# Patient Record
Sex: Female | Born: 1950 | Race: White | Hispanic: No | Marital: Married | Smoking: Former smoker
Health system: Southern US, Community
[De-identification: ages and names within clinical notes are randomized; demographics above are authoritative.]

## PROBLEM LIST (undated history)

## (undated) DIAGNOSIS — K439 Ventral hernia without obstruction or gangrene: Secondary | ICD-10-CM

## (undated) DIAGNOSIS — I2510 Atherosclerotic heart disease of native coronary artery without angina pectoris: Secondary | ICD-10-CM

## (undated) DIAGNOSIS — I70203 Unspecified atherosclerosis of native arteries of extremities, bilateral legs: Secondary | ICD-10-CM

## (undated) DIAGNOSIS — I10 Essential (primary) hypertension: Secondary | ICD-10-CM

## (undated) DIAGNOSIS — I219 Acute myocardial infarction, unspecified: Secondary | ICD-10-CM

## (undated) DIAGNOSIS — K219 Gastro-esophageal reflux disease without esophagitis: Secondary | ICD-10-CM

## (undated) DIAGNOSIS — H548 Legal blindness, as defined in USA: Secondary | ICD-10-CM

## (undated) DIAGNOSIS — K922 Gastrointestinal hemorrhage, unspecified: Secondary | ICD-10-CM

## (undated) DIAGNOSIS — R109 Unspecified abdominal pain: Secondary | ICD-10-CM

## (undated) DIAGNOSIS — F419 Anxiety disorder, unspecified: Secondary | ICD-10-CM

## (undated) DIAGNOSIS — F329 Major depressive disorder, single episode, unspecified: Secondary | ICD-10-CM

## (undated) DIAGNOSIS — E860 Dehydration: Secondary | ICD-10-CM

## (undated) DIAGNOSIS — I959 Hypotension, unspecified: Secondary | ICD-10-CM

## (undated) DIAGNOSIS — C801 Malignant (primary) neoplasm, unspecified: Secondary | ICD-10-CM

## (undated) DIAGNOSIS — E119 Type 2 diabetes mellitus without complications: Secondary | ICD-10-CM

## (undated) DIAGNOSIS — M479 Spondylosis, unspecified: Secondary | ICD-10-CM

## (undated) DIAGNOSIS — I6523 Occlusion and stenosis of bilateral carotid arteries: Secondary | ICD-10-CM

## (undated) DIAGNOSIS — R000 Tachycardia, unspecified: Secondary | ICD-10-CM

## (undated) DIAGNOSIS — L0232 Furuncle of buttock: Secondary | ICD-10-CM

## (undated) DIAGNOSIS — N390 Urinary tract infection, site not specified: Secondary | ICD-10-CM

## (undated) DIAGNOSIS — K430 Incisional hernia with obstruction, without gangrene: Secondary | ICD-10-CM

## (undated) DIAGNOSIS — M1990 Unspecified osteoarthritis, unspecified site: Secondary | ICD-10-CM

## (undated) DIAGNOSIS — E7800 Pure hypercholesterolemia, unspecified: Secondary | ICD-10-CM

## (undated) DIAGNOSIS — D509 Iron deficiency anemia, unspecified: Secondary | ICD-10-CM

## (undated) DIAGNOSIS — K529 Noninfective gastroenteritis and colitis, unspecified: Secondary | ICD-10-CM

## (undated) DIAGNOSIS — I447 Left bundle-branch block, unspecified: Secondary | ICD-10-CM

## (undated) DIAGNOSIS — I739 Peripheral vascular disease, unspecified: Secondary | ICD-10-CM

## (undated) DIAGNOSIS — K635 Polyp of colon: Secondary | ICD-10-CM

## (undated) HISTORY — DX: Hypotension, unspecified: I95.9

## (undated) HISTORY — PX: HAND SURGERY: SHX662

## (undated) HISTORY — PX: ABDOMINAL HERNIA REPAIR: SHX539

## (undated) HISTORY — PX: ABDOMINAL HYSTERECTOMY: SHX81

## (undated) HISTORY — PX: ABDOMINAL AORTIC ANEURYSM REPAIR: SUR1152

## (undated) HISTORY — PX: CHOLECYSTECTOMY: SHX55

## (undated) HISTORY — DX: Unspecified abdominal pain: R10.9

## (undated) HISTORY — DX: Peripheral vascular disease, unspecified: I73.9

## (undated) HISTORY — DX: Gastrointestinal hemorrhage, unspecified: K92.2

## (undated) HISTORY — DX: Major depressive disorder, single episode, unspecified: F32.9

## (undated) HISTORY — DX: Left bundle-branch block, unspecified: I44.7

## (undated) HISTORY — DX: Type 2 diabetes mellitus without complications: E11.9

## (undated) HISTORY — DX: Pure hypercholesterolemia, unspecified: E78.00

## (undated) HISTORY — DX: Dehydration: E86.0

## (undated) HISTORY — DX: Ventral hernia without obstruction or gangrene: K43.9

## (undated) HISTORY — DX: Tachycardia, unspecified: R00.0

## (undated) HISTORY — DX: Polyp of colon: K63.5

## (undated) HISTORY — DX: Acute myocardial infarction, unspecified: I21.9

## (undated) HISTORY — PX: BACK SURGERY: SHX140

## (undated) HISTORY — DX: Atherosclerotic heart disease of native coronary artery without angina pectoris: I25.10

## (undated) HISTORY — DX: Malignant (primary) neoplasm, unspecified: C80.1

## (undated) HISTORY — DX: Noninfective gastroenteritis and colitis, unspecified: K52.9

## (undated) HISTORY — DX: Incisional hernia with obstruction, without gangrene: K43.0

## (undated) HISTORY — DX: Urinary tract infection, site not specified: N39.0

## (undated) HISTORY — DX: Unspecified atherosclerosis of native arteries of extremities, bilateral legs: I70.203

## (undated) HISTORY — DX: Essential (primary) hypertension: I10

## (undated) HISTORY — DX: Spondylosis, unspecified: M47.9

## (undated) HISTORY — DX: Iron deficiency anemia, unspecified: D50.9

## (undated) HISTORY — PX: BYPASS GRAFT: SHX909

## (undated) HISTORY — PX: CARDIAC CATHETERIZATION: SHX172

## (undated) HISTORY — DX: Anxiety disorder, unspecified: F41.9

## (undated) HISTORY — DX: Furuncle of buttock: L02.32

## (undated) HISTORY — PX: TRIGGER FINGER RELEASE: SHX641

## (undated) HISTORY — DX: Occlusion and stenosis of bilateral carotid arteries: I65.23

## (undated) HISTORY — DX: Unspecified osteoarthritis, unspecified site: M19.90

## (undated) HISTORY — DX: Legal blindness, as defined in USA: H54.8

## (undated) HISTORY — DX: Gastro-esophageal reflux disease without esophagitis: K21.9

---

## 1975-06-07 DIAGNOSIS — C801 Malignant (primary) neoplasm, unspecified: Secondary | ICD-10-CM

## 1975-06-07 HISTORY — DX: Malignant (primary) neoplasm, unspecified: C80.1

## 1997-06-06 DIAGNOSIS — I219 Acute myocardial infarction, unspecified: Secondary | ICD-10-CM

## 1997-06-06 HISTORY — DX: Acute myocardial infarction, unspecified: I21.9

## 1997-12-26 ENCOUNTER — Inpatient Hospital Stay (HOSPITAL_COMMUNITY): Admission: EM | Admit: 1997-12-26 | Discharge: 1997-12-30 | Payer: Self-pay | Admitting: Emergency Medicine

## 2009-12-03 DIAGNOSIS — Z9889 Other specified postprocedural states: Secondary | ICD-10-CM

## 2009-12-03 HISTORY — DX: Other specified postprocedural states: Z98.890

## 2012-06-01 DIAGNOSIS — Z23 Encounter for immunization: Secondary | ICD-10-CM | POA: Diagnosis not present

## 2012-07-31 DIAGNOSIS — Z6838 Body mass index (BMI) 38.0-38.9, adult: Secondary | ICD-10-CM | POA: Diagnosis not present

## 2012-07-31 DIAGNOSIS — E7800 Pure hypercholesterolemia, unspecified: Secondary | ICD-10-CM | POA: Diagnosis not present

## 2012-07-31 DIAGNOSIS — I10 Essential (primary) hypertension: Secondary | ICD-10-CM | POA: Diagnosis not present

## 2012-07-31 DIAGNOSIS — R1032 Left lower quadrant pain: Secondary | ICD-10-CM | POA: Diagnosis not present

## 2012-07-31 DIAGNOSIS — E119 Type 2 diabetes mellitus without complications: Secondary | ICD-10-CM | POA: Diagnosis not present

## 2012-07-31 DIAGNOSIS — F329 Major depressive disorder, single episode, unspecified: Secondary | ICD-10-CM | POA: Diagnosis not present

## 2012-08-09 DIAGNOSIS — I798 Other disorders of arteries, arterioles and capillaries in diseases classified elsewhere: Secondary | ICD-10-CM | POA: Diagnosis not present

## 2012-08-09 DIAGNOSIS — E119 Type 2 diabetes mellitus without complications: Secondary | ICD-10-CM | POA: Diagnosis not present

## 2012-08-21 DIAGNOSIS — K591 Functional diarrhea: Secondary | ICD-10-CM | POA: Diagnosis not present

## 2012-08-21 DIAGNOSIS — Z86010 Personal history of colonic polyps: Secondary | ICD-10-CM | POA: Diagnosis not present

## 2012-08-21 DIAGNOSIS — R1032 Left lower quadrant pain: Secondary | ICD-10-CM | POA: Diagnosis not present

## 2012-08-24 DIAGNOSIS — R197 Diarrhea, unspecified: Secondary | ICD-10-CM | POA: Diagnosis not present

## 2012-08-24 DIAGNOSIS — K439 Ventral hernia without obstruction or gangrene: Secondary | ICD-10-CM | POA: Diagnosis not present

## 2012-08-24 DIAGNOSIS — R1032 Left lower quadrant pain: Secondary | ICD-10-CM | POA: Diagnosis not present

## 2012-08-24 DIAGNOSIS — K5730 Diverticulosis of large intestine without perforation or abscess without bleeding: Secondary | ICD-10-CM | POA: Diagnosis not present

## 2012-08-28 DIAGNOSIS — I10 Essential (primary) hypertension: Secondary | ICD-10-CM | POA: Diagnosis not present

## 2012-08-28 DIAGNOSIS — I739 Peripheral vascular disease, unspecified: Secondary | ICD-10-CM | POA: Diagnosis not present

## 2012-08-28 DIAGNOSIS — E119 Type 2 diabetes mellitus without complications: Secondary | ICD-10-CM | POA: Diagnosis not present

## 2012-08-28 DIAGNOSIS — E7800 Pure hypercholesterolemia, unspecified: Secondary | ICD-10-CM | POA: Diagnosis not present

## 2012-08-29 DIAGNOSIS — I739 Peripheral vascular disease, unspecified: Secondary | ICD-10-CM | POA: Diagnosis not present

## 2012-09-05 DIAGNOSIS — Z1231 Encounter for screening mammogram for malignant neoplasm of breast: Secondary | ICD-10-CM | POA: Diagnosis not present

## 2012-09-18 DIAGNOSIS — E119 Type 2 diabetes mellitus without complications: Secondary | ICD-10-CM | POA: Diagnosis not present

## 2012-09-20 DIAGNOSIS — I739 Peripheral vascular disease, unspecified: Secondary | ICD-10-CM | POA: Diagnosis not present

## 2012-09-24 DIAGNOSIS — F329 Major depressive disorder, single episode, unspecified: Secondary | ICD-10-CM | POA: Diagnosis not present

## 2012-09-24 DIAGNOSIS — I739 Peripheral vascular disease, unspecified: Secondary | ICD-10-CM | POA: Diagnosis not present

## 2012-09-24 DIAGNOSIS — D126 Benign neoplasm of colon, unspecified: Secondary | ICD-10-CM | POA: Diagnosis not present

## 2012-09-24 DIAGNOSIS — K591 Functional diarrhea: Secondary | ICD-10-CM | POA: Diagnosis not present

## 2012-09-24 DIAGNOSIS — Z79899 Other long term (current) drug therapy: Secondary | ICD-10-CM | POA: Diagnosis not present

## 2012-09-24 DIAGNOSIS — F411 Generalized anxiety disorder: Secondary | ICD-10-CM | POA: Diagnosis not present

## 2012-09-24 DIAGNOSIS — Z7902 Long term (current) use of antithrombotics/antiplatelets: Secondary | ICD-10-CM | POA: Diagnosis not present

## 2012-09-24 DIAGNOSIS — Z87891 Personal history of nicotine dependence: Secondary | ICD-10-CM | POA: Diagnosis not present

## 2012-09-24 DIAGNOSIS — K648 Other hemorrhoids: Secondary | ICD-10-CM | POA: Diagnosis not present

## 2012-09-24 DIAGNOSIS — I10 Essential (primary) hypertension: Secondary | ICD-10-CM | POA: Diagnosis not present

## 2012-09-24 DIAGNOSIS — I2510 Atherosclerotic heart disease of native coronary artery without angina pectoris: Secondary | ICD-10-CM | POA: Diagnosis not present

## 2012-09-24 DIAGNOSIS — Z7982 Long term (current) use of aspirin: Secondary | ICD-10-CM | POA: Diagnosis not present

## 2012-09-24 DIAGNOSIS — K5730 Diverticulosis of large intestine without perforation or abscess without bleeding: Secondary | ICD-10-CM | POA: Diagnosis not present

## 2012-09-24 DIAGNOSIS — E119 Type 2 diabetes mellitus without complications: Secondary | ICD-10-CM | POA: Diagnosis not present

## 2012-09-24 DIAGNOSIS — E7800 Pure hypercholesterolemia, unspecified: Secondary | ICD-10-CM | POA: Diagnosis not present

## 2012-09-24 DIAGNOSIS — R1032 Left lower quadrant pain: Secondary | ICD-10-CM | POA: Diagnosis not present

## 2012-09-24 DIAGNOSIS — I252 Old myocardial infarction: Secondary | ICD-10-CM | POA: Diagnosis not present

## 2012-09-24 DIAGNOSIS — Z9861 Coronary angioplasty status: Secondary | ICD-10-CM | POA: Diagnosis not present

## 2012-09-25 DIAGNOSIS — Z79899 Other long term (current) drug therapy: Secondary | ICD-10-CM | POA: Diagnosis not present

## 2012-09-25 DIAGNOSIS — E119 Type 2 diabetes mellitus without complications: Secondary | ICD-10-CM | POA: Diagnosis not present

## 2012-09-25 DIAGNOSIS — I10 Essential (primary) hypertension: Secondary | ICD-10-CM | POA: Diagnosis not present

## 2012-09-25 DIAGNOSIS — E7800 Pure hypercholesterolemia, unspecified: Secondary | ICD-10-CM | POA: Diagnosis not present

## 2012-09-25 DIAGNOSIS — Z6838 Body mass index (BMI) 38.0-38.9, adult: Secondary | ICD-10-CM | POA: Diagnosis not present

## 2012-09-25 DIAGNOSIS — I739 Peripheral vascular disease, unspecified: Secondary | ICD-10-CM | POA: Diagnosis not present

## 2012-11-26 DIAGNOSIS — E7800 Pure hypercholesterolemia, unspecified: Secondary | ICD-10-CM | POA: Diagnosis not present

## 2012-11-26 DIAGNOSIS — I739 Peripheral vascular disease, unspecified: Secondary | ICD-10-CM | POA: Diagnosis not present

## 2012-11-26 DIAGNOSIS — I10 Essential (primary) hypertension: Secondary | ICD-10-CM | POA: Diagnosis not present

## 2012-11-26 DIAGNOSIS — Z6837 Body mass index (BMI) 37.0-37.9, adult: Secondary | ICD-10-CM | POA: Diagnosis not present

## 2012-11-26 DIAGNOSIS — E119 Type 2 diabetes mellitus without complications: Secondary | ICD-10-CM | POA: Diagnosis not present

## 2013-01-02 DIAGNOSIS — I10 Essential (primary) hypertension: Secondary | ICD-10-CM | POA: Diagnosis not present

## 2013-01-02 DIAGNOSIS — Z6836 Body mass index (BMI) 36.0-36.9, adult: Secondary | ICD-10-CM | POA: Diagnosis not present

## 2013-01-02 DIAGNOSIS — I739 Peripheral vascular disease, unspecified: Secondary | ICD-10-CM | POA: Diagnosis not present

## 2013-01-02 DIAGNOSIS — E119 Type 2 diabetes mellitus without complications: Secondary | ICD-10-CM | POA: Diagnosis not present

## 2013-01-30 DIAGNOSIS — I10 Essential (primary) hypertension: Secondary | ICD-10-CM | POA: Diagnosis not present

## 2013-01-30 DIAGNOSIS — I739 Peripheral vascular disease, unspecified: Secondary | ICD-10-CM | POA: Diagnosis not present

## 2013-01-30 DIAGNOSIS — Z6836 Body mass index (BMI) 36.0-36.9, adult: Secondary | ICD-10-CM | POA: Diagnosis not present

## 2013-01-30 DIAGNOSIS — E119 Type 2 diabetes mellitus without complications: Secondary | ICD-10-CM | POA: Diagnosis not present

## 2013-02-14 DIAGNOSIS — H3410 Central retinal artery occlusion, unspecified eye: Secondary | ICD-10-CM | POA: Diagnosis not present

## 2013-02-19 DIAGNOSIS — E119 Type 2 diabetes mellitus without complications: Secondary | ICD-10-CM | POA: Diagnosis not present

## 2013-02-19 DIAGNOSIS — I739 Peripheral vascular disease, unspecified: Secondary | ICD-10-CM | POA: Diagnosis not present

## 2013-02-19 DIAGNOSIS — H3410 Central retinal artery occlusion, unspecified eye: Secondary | ICD-10-CM | POA: Diagnosis not present

## 2013-02-19 DIAGNOSIS — Z6836 Body mass index (BMI) 36.0-36.9, adult: Secondary | ICD-10-CM | POA: Diagnosis not present

## 2013-02-21 DIAGNOSIS — H3410 Central retinal artery occlusion, unspecified eye: Secondary | ICD-10-CM | POA: Diagnosis not present

## 2013-02-21 DIAGNOSIS — I658 Occlusion and stenosis of other precerebral arteries: Secondary | ICD-10-CM | POA: Diagnosis not present

## 2013-02-21 DIAGNOSIS — I6529 Occlusion and stenosis of unspecified carotid artery: Secondary | ICD-10-CM | POA: Diagnosis not present

## 2013-03-07 DIAGNOSIS — Z23 Encounter for immunization: Secondary | ICD-10-CM | POA: Diagnosis not present

## 2013-03-28 DIAGNOSIS — H3410 Central retinal artery occlusion, unspecified eye: Secondary | ICD-10-CM | POA: Diagnosis not present

## 2013-04-03 DIAGNOSIS — E119 Type 2 diabetes mellitus without complications: Secondary | ICD-10-CM | POA: Diagnosis not present

## 2013-04-03 DIAGNOSIS — Z6836 Body mass index (BMI) 36.0-36.9, adult: Secondary | ICD-10-CM | POA: Diagnosis not present

## 2013-04-03 DIAGNOSIS — I10 Essential (primary) hypertension: Secondary | ICD-10-CM | POA: Diagnosis not present

## 2013-04-03 DIAGNOSIS — E7800 Pure hypercholesterolemia, unspecified: Secondary | ICD-10-CM | POA: Diagnosis not present

## 2013-04-03 DIAGNOSIS — I739 Peripheral vascular disease, unspecified: Secondary | ICD-10-CM | POA: Diagnosis not present

## 2013-06-05 DIAGNOSIS — I739 Peripheral vascular disease, unspecified: Secondary | ICD-10-CM | POA: Diagnosis not present

## 2013-06-05 DIAGNOSIS — E119 Type 2 diabetes mellitus without complications: Secondary | ICD-10-CM | POA: Diagnosis not present

## 2013-06-05 DIAGNOSIS — I2510 Atherosclerotic heart disease of native coronary artery without angina pectoris: Secondary | ICD-10-CM | POA: Diagnosis not present

## 2013-06-05 DIAGNOSIS — I10 Essential (primary) hypertension: Secondary | ICD-10-CM | POA: Diagnosis not present

## 2013-07-04 DIAGNOSIS — E119 Type 2 diabetes mellitus without complications: Secondary | ICD-10-CM | POA: Diagnosis not present

## 2013-07-04 DIAGNOSIS — E7800 Pure hypercholesterolemia, unspecified: Secondary | ICD-10-CM | POA: Diagnosis not present

## 2013-07-04 DIAGNOSIS — Z0000 Encounter for general adult medical examination without abnormal findings: Secondary | ICD-10-CM | POA: Diagnosis not present

## 2013-07-04 DIAGNOSIS — I10 Essential (primary) hypertension: Secondary | ICD-10-CM | POA: Diagnosis not present

## 2013-07-09 DIAGNOSIS — Z125 Encounter for screening for malignant neoplasm of prostate: Secondary | ICD-10-CM | POA: Diagnosis not present

## 2013-08-07 DIAGNOSIS — I739 Peripheral vascular disease, unspecified: Secondary | ICD-10-CM | POA: Diagnosis not present

## 2013-08-07 DIAGNOSIS — E119 Type 2 diabetes mellitus without complications: Secondary | ICD-10-CM | POA: Diagnosis not present

## 2013-08-07 DIAGNOSIS — I10 Essential (primary) hypertension: Secondary | ICD-10-CM | POA: Diagnosis not present

## 2013-08-07 DIAGNOSIS — E7800 Pure hypercholesterolemia, unspecified: Secondary | ICD-10-CM | POA: Diagnosis not present

## 2013-08-29 DIAGNOSIS — I10 Essential (primary) hypertension: Secondary | ICD-10-CM | POA: Diagnosis not present

## 2013-08-29 DIAGNOSIS — I739 Peripheral vascular disease, unspecified: Secondary | ICD-10-CM | POA: Diagnosis not present

## 2013-08-29 DIAGNOSIS — E785 Hyperlipidemia, unspecified: Secondary | ICD-10-CM | POA: Diagnosis not present

## 2013-08-29 DIAGNOSIS — I6529 Occlusion and stenosis of unspecified carotid artery: Secondary | ICD-10-CM | POA: Diagnosis not present

## 2013-08-29 DIAGNOSIS — I658 Occlusion and stenosis of other precerebral arteries: Secondary | ICD-10-CM | POA: Diagnosis not present

## 2013-09-05 DIAGNOSIS — I739 Peripheral vascular disease, unspecified: Secondary | ICD-10-CM | POA: Diagnosis not present

## 2013-09-05 DIAGNOSIS — E119 Type 2 diabetes mellitus without complications: Secondary | ICD-10-CM | POA: Diagnosis not present

## 2013-09-05 DIAGNOSIS — I10 Essential (primary) hypertension: Secondary | ICD-10-CM | POA: Diagnosis not present

## 2013-09-05 DIAGNOSIS — E7800 Pure hypercholesterolemia, unspecified: Secondary | ICD-10-CM | POA: Diagnosis not present

## 2013-10-07 DIAGNOSIS — E7800 Pure hypercholesterolemia, unspecified: Secondary | ICD-10-CM | POA: Diagnosis not present

## 2013-10-07 DIAGNOSIS — E119 Type 2 diabetes mellitus without complications: Secondary | ICD-10-CM | POA: Diagnosis not present

## 2013-10-07 DIAGNOSIS — I2510 Atherosclerotic heart disease of native coronary artery without angina pectoris: Secondary | ICD-10-CM | POA: Diagnosis not present

## 2013-10-07 DIAGNOSIS — I739 Peripheral vascular disease, unspecified: Secondary | ICD-10-CM | POA: Diagnosis not present

## 2013-10-22 DIAGNOSIS — Z1231 Encounter for screening mammogram for malignant neoplasm of breast: Secondary | ICD-10-CM | POA: Diagnosis not present

## 2013-11-07 DIAGNOSIS — I2510 Atherosclerotic heart disease of native coronary artery without angina pectoris: Secondary | ICD-10-CM | POA: Diagnosis not present

## 2013-11-07 DIAGNOSIS — E7800 Pure hypercholesterolemia, unspecified: Secondary | ICD-10-CM | POA: Diagnosis not present

## 2013-11-07 DIAGNOSIS — I10 Essential (primary) hypertension: Secondary | ICD-10-CM | POA: Diagnosis not present

## 2013-11-07 DIAGNOSIS — E119 Type 2 diabetes mellitus without complications: Secondary | ICD-10-CM | POA: Diagnosis not present

## 2014-01-07 DIAGNOSIS — I739 Peripheral vascular disease, unspecified: Secondary | ICD-10-CM | POA: Diagnosis not present

## 2014-01-07 DIAGNOSIS — E7800 Pure hypercholesterolemia, unspecified: Secondary | ICD-10-CM | POA: Diagnosis not present

## 2014-01-07 DIAGNOSIS — E119 Type 2 diabetes mellitus without complications: Secondary | ICD-10-CM | POA: Diagnosis not present

## 2014-01-07 DIAGNOSIS — F3289 Other specified depressive episodes: Secondary | ICD-10-CM | POA: Diagnosis not present

## 2014-01-07 DIAGNOSIS — I2510 Atherosclerotic heart disease of native coronary artery without angina pectoris: Secondary | ICD-10-CM | POA: Diagnosis not present

## 2014-01-07 DIAGNOSIS — F329 Major depressive disorder, single episode, unspecified: Secondary | ICD-10-CM | POA: Diagnosis not present

## 2014-02-14 DIAGNOSIS — R079 Chest pain, unspecified: Secondary | ICD-10-CM | POA: Diagnosis not present

## 2014-02-14 DIAGNOSIS — K8020 Calculus of gallbladder without cholecystitis without obstruction: Secondary | ICD-10-CM | POA: Diagnosis not present

## 2014-02-14 DIAGNOSIS — I639 Cerebral infarction, unspecified: Secondary | ICD-10-CM

## 2014-02-14 DIAGNOSIS — R109 Unspecified abdominal pain: Secondary | ICD-10-CM | POA: Diagnosis not present

## 2014-02-14 DIAGNOSIS — R9431 Abnormal electrocardiogram [ECG] [EKG]: Secondary | ICD-10-CM | POA: Diagnosis not present

## 2014-02-14 DIAGNOSIS — R112 Nausea with vomiting, unspecified: Secondary | ICD-10-CM | POA: Diagnosis not present

## 2014-02-14 HISTORY — DX: Cerebral infarction, unspecified: I63.9

## 2014-02-18 DIAGNOSIS — R1013 Epigastric pain: Secondary | ICD-10-CM | POA: Diagnosis not present

## 2014-02-18 DIAGNOSIS — R1011 Right upper quadrant pain: Secondary | ICD-10-CM | POA: Diagnosis not present

## 2014-02-18 DIAGNOSIS — K8010 Calculus of gallbladder with chronic cholecystitis without obstruction: Secondary | ICD-10-CM | POA: Diagnosis not present

## 2014-02-18 DIAGNOSIS — R1110 Vomiting, unspecified: Secondary | ICD-10-CM | POA: Diagnosis not present

## 2014-02-19 DIAGNOSIS — Z01818 Encounter for other preprocedural examination: Secondary | ICD-10-CM | POA: Diagnosis not present

## 2014-02-19 DIAGNOSIS — I739 Peripheral vascular disease, unspecified: Secondary | ICD-10-CM | POA: Diagnosis not present

## 2014-02-19 DIAGNOSIS — E785 Hyperlipidemia, unspecified: Secondary | ICD-10-CM | POA: Diagnosis not present

## 2014-02-19 DIAGNOSIS — I447 Left bundle-branch block, unspecified: Secondary | ICD-10-CM | POA: Diagnosis not present

## 2014-02-20 DIAGNOSIS — Z01810 Encounter for preprocedural cardiovascular examination: Secondary | ICD-10-CM | POA: Diagnosis not present

## 2014-02-20 DIAGNOSIS — I2510 Atherosclerotic heart disease of native coronary artery without angina pectoris: Secondary | ICD-10-CM | POA: Diagnosis not present

## 2014-03-05 DIAGNOSIS — I252 Old myocardial infarction: Secondary | ICD-10-CM | POA: Diagnosis not present

## 2014-03-05 DIAGNOSIS — Z7902 Long term (current) use of antithrombotics/antiplatelets: Secondary | ICD-10-CM | POA: Diagnosis not present

## 2014-03-05 DIAGNOSIS — H539 Unspecified visual disturbance: Secondary | ICD-10-CM | POA: Diagnosis not present

## 2014-03-05 DIAGNOSIS — I2510 Atherosclerotic heart disease of native coronary artery without angina pectoris: Secondary | ICD-10-CM | POA: Diagnosis not present

## 2014-03-05 DIAGNOSIS — K8000 Calculus of gallbladder with acute cholecystitis without obstruction: Secondary | ICD-10-CM | POA: Diagnosis not present

## 2014-03-05 DIAGNOSIS — K660 Peritoneal adhesions (postprocedural) (postinfection): Secondary | ICD-10-CM | POA: Diagnosis not present

## 2014-03-05 DIAGNOSIS — K8020 Calculus of gallbladder without cholecystitis without obstruction: Secondary | ICD-10-CM | POA: Diagnosis not present

## 2014-03-05 DIAGNOSIS — I69998 Other sequelae following unspecified cerebrovascular disease: Secondary | ICD-10-CM | POA: Diagnosis not present

## 2014-03-05 DIAGNOSIS — Z9861 Coronary angioplasty status: Secondary | ICD-10-CM | POA: Diagnosis not present

## 2014-03-05 DIAGNOSIS — K219 Gastro-esophageal reflux disease without esophagitis: Secondary | ICD-10-CM | POA: Diagnosis not present

## 2014-03-05 DIAGNOSIS — Z79899 Other long term (current) drug therapy: Secondary | ICD-10-CM | POA: Diagnosis not present

## 2014-03-05 DIAGNOSIS — E669 Obesity, unspecified: Secondary | ICD-10-CM | POA: Diagnosis not present

## 2014-03-05 DIAGNOSIS — K8010 Calculus of gallbladder with chronic cholecystitis without obstruction: Secondary | ICD-10-CM | POA: Diagnosis not present

## 2014-03-05 DIAGNOSIS — H5440 Blindness, one eye, unspecified eye: Secondary | ICD-10-CM | POA: Diagnosis not present

## 2014-03-12 DIAGNOSIS — Z23 Encounter for immunization: Secondary | ICD-10-CM | POA: Diagnosis not present

## 2014-03-20 DIAGNOSIS — I2510 Atherosclerotic heart disease of native coronary artery without angina pectoris: Secondary | ICD-10-CM | POA: Diagnosis not present

## 2014-03-20 DIAGNOSIS — I739 Peripheral vascular disease, unspecified: Secondary | ICD-10-CM | POA: Diagnosis not present

## 2014-03-20 DIAGNOSIS — E1159 Type 2 diabetes mellitus with other circulatory complications: Secondary | ICD-10-CM | POA: Diagnosis not present

## 2014-03-20 DIAGNOSIS — F419 Anxiety disorder, unspecified: Secondary | ICD-10-CM | POA: Diagnosis not present

## 2014-03-20 DIAGNOSIS — F329 Major depressive disorder, single episode, unspecified: Secondary | ICD-10-CM | POA: Diagnosis not present

## 2014-03-20 DIAGNOSIS — I10 Essential (primary) hypertension: Secondary | ICD-10-CM | POA: Diagnosis not present

## 2014-04-25 DIAGNOSIS — H3413 Central retinal artery occlusion, bilateral: Secondary | ICD-10-CM | POA: Diagnosis not present

## 2014-05-22 DIAGNOSIS — E780 Pure hypercholesterolemia: Secondary | ICD-10-CM | POA: Diagnosis not present

## 2014-05-22 DIAGNOSIS — I10 Essential (primary) hypertension: Secondary | ICD-10-CM | POA: Diagnosis not present

## 2014-05-22 DIAGNOSIS — I2510 Atherosclerotic heart disease of native coronary artery without angina pectoris: Secondary | ICD-10-CM | POA: Diagnosis not present

## 2014-05-22 DIAGNOSIS — F419 Anxiety disorder, unspecified: Secondary | ICD-10-CM | POA: Diagnosis not present

## 2014-05-22 DIAGNOSIS — E1159 Type 2 diabetes mellitus with other circulatory complications: Secondary | ICD-10-CM | POA: Diagnosis not present

## 2014-05-22 DIAGNOSIS — I739 Peripheral vascular disease, unspecified: Secondary | ICD-10-CM | POA: Diagnosis not present

## 2014-05-22 DIAGNOSIS — F329 Major depressive disorder, single episode, unspecified: Secondary | ICD-10-CM | POA: Diagnosis not present

## 2014-05-22 DIAGNOSIS — Z6836 Body mass index (BMI) 36.0-36.9, adult: Secondary | ICD-10-CM | POA: Diagnosis not present

## 2014-07-25 DIAGNOSIS — E780 Pure hypercholesterolemia: Secondary | ICD-10-CM | POA: Diagnosis not present

## 2014-07-25 DIAGNOSIS — I739 Peripheral vascular disease, unspecified: Secondary | ICD-10-CM | POA: Diagnosis not present

## 2014-07-25 DIAGNOSIS — M109 Gout, unspecified: Secondary | ICD-10-CM | POA: Diagnosis not present

## 2014-07-25 DIAGNOSIS — E1159 Type 2 diabetes mellitus with other circulatory complications: Secondary | ICD-10-CM | POA: Diagnosis not present

## 2014-07-25 DIAGNOSIS — I10 Essential (primary) hypertension: Secondary | ICD-10-CM | POA: Diagnosis not present

## 2014-07-25 DIAGNOSIS — Z6837 Body mass index (BMI) 37.0-37.9, adult: Secondary | ICD-10-CM | POA: Diagnosis not present

## 2014-07-25 DIAGNOSIS — F329 Major depressive disorder, single episode, unspecified: Secondary | ICD-10-CM | POA: Diagnosis not present

## 2014-07-25 DIAGNOSIS — F419 Anxiety disorder, unspecified: Secondary | ICD-10-CM | POA: Diagnosis not present

## 2014-07-25 DIAGNOSIS — I2510 Atherosclerotic heart disease of native coronary artery without angina pectoris: Secondary | ICD-10-CM | POA: Diagnosis not present

## 2014-08-22 DIAGNOSIS — Z6837 Body mass index (BMI) 37.0-37.9, adult: Secondary | ICD-10-CM | POA: Diagnosis not present

## 2014-08-22 DIAGNOSIS — E780 Pure hypercholesterolemia: Secondary | ICD-10-CM | POA: Diagnosis not present

## 2014-08-22 DIAGNOSIS — F419 Anxiety disorder, unspecified: Secondary | ICD-10-CM | POA: Diagnosis not present

## 2014-08-22 DIAGNOSIS — I10 Essential (primary) hypertension: Secondary | ICD-10-CM | POA: Diagnosis not present

## 2014-08-22 DIAGNOSIS — I2510 Atherosclerotic heart disease of native coronary artery without angina pectoris: Secondary | ICD-10-CM | POA: Diagnosis not present

## 2014-08-22 DIAGNOSIS — E1159 Type 2 diabetes mellitus with other circulatory complications: Secondary | ICD-10-CM | POA: Diagnosis not present

## 2014-08-22 DIAGNOSIS — F329 Major depressive disorder, single episode, unspecified: Secondary | ICD-10-CM | POA: Diagnosis not present

## 2014-08-22 DIAGNOSIS — I739 Peripheral vascular disease, unspecified: Secondary | ICD-10-CM | POA: Diagnosis not present

## 2014-08-22 DIAGNOSIS — M109 Gout, unspecified: Secondary | ICD-10-CM | POA: Diagnosis not present

## 2014-08-22 DIAGNOSIS — Z23 Encounter for immunization: Secondary | ICD-10-CM | POA: Diagnosis not present

## 2014-09-01 DIAGNOSIS — I6523 Occlusion and stenosis of bilateral carotid arteries: Secondary | ICD-10-CM | POA: Diagnosis not present

## 2014-09-01 DIAGNOSIS — I739 Peripheral vascular disease, unspecified: Secondary | ICD-10-CM | POA: Diagnosis not present

## 2014-09-01 DIAGNOSIS — I70203 Unspecified atherosclerosis of native arteries of extremities, bilateral legs: Secondary | ICD-10-CM | POA: Diagnosis not present

## 2014-09-01 DIAGNOSIS — Z95820 Peripheral vascular angioplasty status with implants and grafts: Secondary | ICD-10-CM | POA: Diagnosis not present

## 2014-09-19 DIAGNOSIS — E780 Pure hypercholesterolemia: Secondary | ICD-10-CM | POA: Diagnosis not present

## 2014-09-19 DIAGNOSIS — Z6836 Body mass index (BMI) 36.0-36.9, adult: Secondary | ICD-10-CM | POA: Diagnosis not present

## 2014-09-19 DIAGNOSIS — I739 Peripheral vascular disease, unspecified: Secondary | ICD-10-CM | POA: Diagnosis not present

## 2014-09-19 DIAGNOSIS — I2510 Atherosclerotic heart disease of native coronary artery without angina pectoris: Secondary | ICD-10-CM | POA: Diagnosis not present

## 2014-09-19 DIAGNOSIS — F419 Anxiety disorder, unspecified: Secondary | ICD-10-CM | POA: Diagnosis not present

## 2014-09-19 DIAGNOSIS — F329 Major depressive disorder, single episode, unspecified: Secondary | ICD-10-CM | POA: Diagnosis not present

## 2014-09-19 DIAGNOSIS — I10 Essential (primary) hypertension: Secondary | ICD-10-CM | POA: Diagnosis not present

## 2014-09-19 DIAGNOSIS — M109 Gout, unspecified: Secondary | ICD-10-CM | POA: Diagnosis not present

## 2014-09-19 DIAGNOSIS — E1159 Type 2 diabetes mellitus with other circulatory complications: Secondary | ICD-10-CM | POA: Diagnosis not present

## 2014-09-19 DIAGNOSIS — K219 Gastro-esophageal reflux disease without esophagitis: Secondary | ICD-10-CM | POA: Diagnosis not present

## 2014-10-17 DIAGNOSIS — K219 Gastro-esophageal reflux disease without esophagitis: Secondary | ICD-10-CM | POA: Diagnosis not present

## 2014-10-17 DIAGNOSIS — Z6836 Body mass index (BMI) 36.0-36.9, adult: Secondary | ICD-10-CM | POA: Diagnosis not present

## 2014-10-17 DIAGNOSIS — M109 Gout, unspecified: Secondary | ICD-10-CM | POA: Diagnosis not present

## 2014-10-17 DIAGNOSIS — E780 Pure hypercholesterolemia: Secondary | ICD-10-CM | POA: Diagnosis not present

## 2014-10-17 DIAGNOSIS — Z79899 Other long term (current) drug therapy: Secondary | ICD-10-CM | POA: Diagnosis not present

## 2014-10-17 DIAGNOSIS — I2510 Atherosclerotic heart disease of native coronary artery without angina pectoris: Secondary | ICD-10-CM | POA: Diagnosis not present

## 2014-10-17 DIAGNOSIS — I739 Peripheral vascular disease, unspecified: Secondary | ICD-10-CM | POA: Diagnosis not present

## 2014-10-17 DIAGNOSIS — I10 Essential (primary) hypertension: Secondary | ICD-10-CM | POA: Diagnosis not present

## 2014-10-17 DIAGNOSIS — F329 Major depressive disorder, single episode, unspecified: Secondary | ICD-10-CM | POA: Diagnosis not present

## 2014-10-17 DIAGNOSIS — F419 Anxiety disorder, unspecified: Secondary | ICD-10-CM | POA: Diagnosis not present

## 2014-10-17 DIAGNOSIS — E1159 Type 2 diabetes mellitus with other circulatory complications: Secondary | ICD-10-CM | POA: Diagnosis not present

## 2014-11-14 DIAGNOSIS — K219 Gastro-esophageal reflux disease without esophagitis: Secondary | ICD-10-CM | POA: Diagnosis not present

## 2014-11-14 DIAGNOSIS — I739 Peripheral vascular disease, unspecified: Secondary | ICD-10-CM | POA: Diagnosis not present

## 2014-11-14 DIAGNOSIS — E780 Pure hypercholesterolemia: Secondary | ICD-10-CM | POA: Diagnosis not present

## 2014-11-14 DIAGNOSIS — E1159 Type 2 diabetes mellitus with other circulatory complications: Secondary | ICD-10-CM | POA: Diagnosis not present

## 2014-11-14 DIAGNOSIS — Z6836 Body mass index (BMI) 36.0-36.9, adult: Secondary | ICD-10-CM | POA: Diagnosis not present

## 2014-11-14 DIAGNOSIS — I10 Essential (primary) hypertension: Secondary | ICD-10-CM | POA: Diagnosis not present

## 2014-11-14 DIAGNOSIS — F419 Anxiety disorder, unspecified: Secondary | ICD-10-CM | POA: Diagnosis not present

## 2014-11-14 DIAGNOSIS — I2510 Atherosclerotic heart disease of native coronary artery without angina pectoris: Secondary | ICD-10-CM | POA: Diagnosis not present

## 2014-11-14 DIAGNOSIS — Z1231 Encounter for screening mammogram for malignant neoplasm of breast: Secondary | ICD-10-CM | POA: Diagnosis not present

## 2014-11-14 DIAGNOSIS — M109 Gout, unspecified: Secondary | ICD-10-CM | POA: Diagnosis not present

## 2014-11-14 DIAGNOSIS — F329 Major depressive disorder, single episode, unspecified: Secondary | ICD-10-CM | POA: Diagnosis not present

## 2014-12-12 DIAGNOSIS — K219 Gastro-esophageal reflux disease without esophagitis: Secondary | ICD-10-CM | POA: Diagnosis not present

## 2014-12-12 DIAGNOSIS — M109 Gout, unspecified: Secondary | ICD-10-CM | POA: Diagnosis not present

## 2014-12-12 DIAGNOSIS — F329 Major depressive disorder, single episode, unspecified: Secondary | ICD-10-CM | POA: Diagnosis not present

## 2014-12-12 DIAGNOSIS — I10 Essential (primary) hypertension: Secondary | ICD-10-CM | POA: Diagnosis not present

## 2014-12-12 DIAGNOSIS — F419 Anxiety disorder, unspecified: Secondary | ICD-10-CM | POA: Diagnosis not present

## 2014-12-12 DIAGNOSIS — E780 Pure hypercholesterolemia: Secondary | ICD-10-CM | POA: Diagnosis not present

## 2014-12-12 DIAGNOSIS — Z6835 Body mass index (BMI) 35.0-35.9, adult: Secondary | ICD-10-CM | POA: Diagnosis not present

## 2014-12-12 DIAGNOSIS — I739 Peripheral vascular disease, unspecified: Secondary | ICD-10-CM | POA: Diagnosis not present

## 2014-12-12 DIAGNOSIS — I2510 Atherosclerotic heart disease of native coronary artery without angina pectoris: Secondary | ICD-10-CM | POA: Diagnosis not present

## 2014-12-12 DIAGNOSIS — E1159 Type 2 diabetes mellitus with other circulatory complications: Secondary | ICD-10-CM | POA: Diagnosis not present

## 2015-02-10 DIAGNOSIS — E1159 Type 2 diabetes mellitus with other circulatory complications: Secondary | ICD-10-CM | POA: Diagnosis not present

## 2015-02-10 DIAGNOSIS — I739 Peripheral vascular disease, unspecified: Secondary | ICD-10-CM | POA: Diagnosis not present

## 2015-02-10 DIAGNOSIS — F419 Anxiety disorder, unspecified: Secondary | ICD-10-CM | POA: Diagnosis not present

## 2015-02-10 DIAGNOSIS — M109 Gout, unspecified: Secondary | ICD-10-CM | POA: Diagnosis not present

## 2015-02-10 DIAGNOSIS — I2510 Atherosclerotic heart disease of native coronary artery without angina pectoris: Secondary | ICD-10-CM | POA: Diagnosis not present

## 2015-02-10 DIAGNOSIS — Z6835 Body mass index (BMI) 35.0-35.9, adult: Secondary | ICD-10-CM | POA: Diagnosis not present

## 2015-02-10 DIAGNOSIS — E780 Pure hypercholesterolemia: Secondary | ICD-10-CM | POA: Diagnosis not present

## 2015-02-10 DIAGNOSIS — K219 Gastro-esophageal reflux disease without esophagitis: Secondary | ICD-10-CM | POA: Diagnosis not present

## 2015-02-10 DIAGNOSIS — I10 Essential (primary) hypertension: Secondary | ICD-10-CM | POA: Diagnosis not present

## 2015-02-10 DIAGNOSIS — F329 Major depressive disorder, single episode, unspecified: Secondary | ICD-10-CM | POA: Diagnosis not present

## 2015-03-02 DIAGNOSIS — Z23 Encounter for immunization: Secondary | ICD-10-CM | POA: Diagnosis not present

## 2015-04-14 DIAGNOSIS — I739 Peripheral vascular disease, unspecified: Secondary | ICD-10-CM | POA: Diagnosis not present

## 2015-04-14 DIAGNOSIS — E7800 Pure hypercholesterolemia, unspecified: Secondary | ICD-10-CM | POA: Diagnosis not present

## 2015-04-14 DIAGNOSIS — Z6835 Body mass index (BMI) 35.0-35.9, adult: Secondary | ICD-10-CM | POA: Diagnosis not present

## 2015-04-14 DIAGNOSIS — M109 Gout, unspecified: Secondary | ICD-10-CM | POA: Diagnosis not present

## 2015-04-14 DIAGNOSIS — K219 Gastro-esophageal reflux disease without esophagitis: Secondary | ICD-10-CM | POA: Diagnosis not present

## 2015-04-14 DIAGNOSIS — E1159 Type 2 diabetes mellitus with other circulatory complications: Secondary | ICD-10-CM | POA: Diagnosis not present

## 2015-04-14 DIAGNOSIS — F419 Anxiety disorder, unspecified: Secondary | ICD-10-CM | POA: Diagnosis not present

## 2015-04-14 DIAGNOSIS — I2510 Atherosclerotic heart disease of native coronary artery without angina pectoris: Secondary | ICD-10-CM | POA: Diagnosis not present

## 2015-04-14 DIAGNOSIS — F329 Major depressive disorder, single episode, unspecified: Secondary | ICD-10-CM | POA: Diagnosis not present

## 2015-04-14 DIAGNOSIS — I10 Essential (primary) hypertension: Secondary | ICD-10-CM | POA: Diagnosis not present

## 2015-04-27 DIAGNOSIS — E119 Type 2 diabetes mellitus without complications: Secondary | ICD-10-CM | POA: Diagnosis not present

## 2015-06-07 HISTORY — PX: HERNIA REPAIR: SHX51

## 2015-07-16 DIAGNOSIS — Z1389 Encounter for screening for other disorder: Secondary | ICD-10-CM | POA: Diagnosis not present

## 2015-07-16 DIAGNOSIS — I10 Essential (primary) hypertension: Secondary | ICD-10-CM | POA: Diagnosis not present

## 2015-07-16 DIAGNOSIS — I739 Peripheral vascular disease, unspecified: Secondary | ICD-10-CM | POA: Diagnosis not present

## 2015-07-16 DIAGNOSIS — E7800 Pure hypercholesterolemia, unspecified: Secondary | ICD-10-CM | POA: Diagnosis not present

## 2015-07-16 DIAGNOSIS — I2510 Atherosclerotic heart disease of native coronary artery without angina pectoris: Secondary | ICD-10-CM | POA: Diagnosis not present

## 2015-07-16 DIAGNOSIS — K219 Gastro-esophageal reflux disease without esophagitis: Secondary | ICD-10-CM | POA: Diagnosis not present

## 2015-07-16 DIAGNOSIS — Z6833 Body mass index (BMI) 33.0-33.9, adult: Secondary | ICD-10-CM | POA: Diagnosis not present

## 2015-07-16 DIAGNOSIS — F329 Major depressive disorder, single episode, unspecified: Secondary | ICD-10-CM | POA: Diagnosis not present

## 2015-07-16 DIAGNOSIS — F419 Anxiety disorder, unspecified: Secondary | ICD-10-CM | POA: Diagnosis not present

## 2015-07-16 DIAGNOSIS — E1159 Type 2 diabetes mellitus with other circulatory complications: Secondary | ICD-10-CM | POA: Diagnosis not present

## 2015-07-16 DIAGNOSIS — M109 Gout, unspecified: Secondary | ICD-10-CM | POA: Diagnosis not present

## 2015-09-08 DIAGNOSIS — F419 Anxiety disorder, unspecified: Secondary | ICD-10-CM | POA: Diagnosis not present

## 2015-09-08 DIAGNOSIS — M109 Gout, unspecified: Secondary | ICD-10-CM | POA: Diagnosis not present

## 2015-09-08 DIAGNOSIS — E1159 Type 2 diabetes mellitus with other circulatory complications: Secondary | ICD-10-CM | POA: Diagnosis not present

## 2015-09-08 DIAGNOSIS — F329 Major depressive disorder, single episode, unspecified: Secondary | ICD-10-CM | POA: Diagnosis not present

## 2015-09-08 DIAGNOSIS — E7800 Pure hypercholesterolemia, unspecified: Secondary | ICD-10-CM | POA: Diagnosis not present

## 2015-09-08 DIAGNOSIS — I2510 Atherosclerotic heart disease of native coronary artery without angina pectoris: Secondary | ICD-10-CM | POA: Diagnosis not present

## 2015-09-08 DIAGNOSIS — I10 Essential (primary) hypertension: Secondary | ICD-10-CM | POA: Diagnosis not present

## 2015-09-08 DIAGNOSIS — K219 Gastro-esophageal reflux disease without esophagitis: Secondary | ICD-10-CM | POA: Diagnosis not present

## 2015-09-08 DIAGNOSIS — I739 Peripheral vascular disease, unspecified: Secondary | ICD-10-CM | POA: Diagnosis not present

## 2015-09-08 DIAGNOSIS — M25511 Pain in right shoulder: Secondary | ICD-10-CM | POA: Diagnosis not present

## 2015-09-08 DIAGNOSIS — Z6833 Body mass index (BMI) 33.0-33.9, adult: Secondary | ICD-10-CM | POA: Diagnosis not present

## 2015-09-28 DIAGNOSIS — I6523 Occlusion and stenosis of bilateral carotid arteries: Secondary | ICD-10-CM | POA: Insufficient documentation

## 2015-09-28 DIAGNOSIS — I739 Peripheral vascular disease, unspecified: Secondary | ICD-10-CM

## 2015-09-28 DIAGNOSIS — Z7982 Long term (current) use of aspirin: Secondary | ICD-10-CM | POA: Diagnosis not present

## 2015-09-28 DIAGNOSIS — I25119 Atherosclerotic heart disease of native coronary artery with unspecified angina pectoris: Secondary | ICD-10-CM | POA: Insufficient documentation

## 2015-09-28 DIAGNOSIS — K439 Ventral hernia without obstruction or gangrene: Secondary | ICD-10-CM

## 2015-09-28 DIAGNOSIS — I70203 Unspecified atherosclerosis of native arteries of extremities, bilateral legs: Secondary | ICD-10-CM

## 2015-09-28 DIAGNOSIS — Z7901 Long term (current) use of anticoagulants: Secondary | ICD-10-CM | POA: Diagnosis not present

## 2015-09-28 DIAGNOSIS — Z95820 Peripheral vascular angioplasty status with implants and grafts: Secondary | ICD-10-CM | POA: Diagnosis not present

## 2015-09-28 HISTORY — DX: Peripheral vascular disease, unspecified: I73.9

## 2015-09-28 HISTORY — DX: Unspecified atherosclerosis of native arteries of extremities, bilateral legs: I70.203

## 2015-09-28 HISTORY — DX: Ventral hernia without obstruction or gangrene: K43.9

## 2015-09-28 HISTORY — DX: Occlusion and stenosis of bilateral carotid arteries: I65.23

## 2015-09-28 HISTORY — DX: Atherosclerotic heart disease of native coronary artery with unspecified angina pectoris: I25.119

## 2015-09-29 DIAGNOSIS — R110 Nausea: Secondary | ICD-10-CM | POA: Diagnosis not present

## 2015-09-29 DIAGNOSIS — R1032 Left lower quadrant pain: Secondary | ICD-10-CM | POA: Diagnosis not present

## 2015-09-29 DIAGNOSIS — E119 Type 2 diabetes mellitus without complications: Secondary | ICD-10-CM | POA: Diagnosis not present

## 2015-09-29 DIAGNOSIS — K430 Incisional hernia with obstruction, without gangrene: Secondary | ICD-10-CM | POA: Diagnosis not present

## 2015-10-01 DIAGNOSIS — K432 Incisional hernia without obstruction or gangrene: Secondary | ICD-10-CM | POA: Diagnosis not present

## 2015-10-01 DIAGNOSIS — Z951 Presence of aortocoronary bypass graft: Secondary | ICD-10-CM | POA: Diagnosis not present

## 2015-10-01 DIAGNOSIS — K430 Incisional hernia with obstruction, without gangrene: Secondary | ICD-10-CM | POA: Diagnosis not present

## 2015-10-01 DIAGNOSIS — R1032 Left lower quadrant pain: Secondary | ICD-10-CM | POA: Diagnosis not present

## 2015-10-08 DIAGNOSIS — R1032 Left lower quadrant pain: Secondary | ICD-10-CM | POA: Diagnosis not present

## 2015-10-08 DIAGNOSIS — K430 Incisional hernia with obstruction, without gangrene: Secondary | ICD-10-CM | POA: Diagnosis not present

## 2015-10-08 DIAGNOSIS — E119 Type 2 diabetes mellitus without complications: Secondary | ICD-10-CM | POA: Diagnosis not present

## 2015-10-08 DIAGNOSIS — I10 Essential (primary) hypertension: Secondary | ICD-10-CM | POA: Diagnosis not present

## 2015-10-13 DIAGNOSIS — F419 Anxiety disorder, unspecified: Secondary | ICD-10-CM | POA: Diagnosis not present

## 2015-10-13 DIAGNOSIS — E1159 Type 2 diabetes mellitus with other circulatory complications: Secondary | ICD-10-CM | POA: Diagnosis not present

## 2015-10-13 DIAGNOSIS — E7800 Pure hypercholesterolemia, unspecified: Secondary | ICD-10-CM | POA: Diagnosis not present

## 2015-10-13 DIAGNOSIS — I10 Essential (primary) hypertension: Secondary | ICD-10-CM | POA: Diagnosis not present

## 2015-10-13 DIAGNOSIS — I2510 Atherosclerotic heart disease of native coronary artery without angina pectoris: Secondary | ICD-10-CM | POA: Diagnosis not present

## 2015-10-13 DIAGNOSIS — I739 Peripheral vascular disease, unspecified: Secondary | ICD-10-CM | POA: Diagnosis not present

## 2015-10-13 DIAGNOSIS — M109 Gout, unspecified: Secondary | ICD-10-CM | POA: Diagnosis not present

## 2015-10-13 DIAGNOSIS — K219 Gastro-esophageal reflux disease without esophagitis: Secondary | ICD-10-CM | POA: Diagnosis not present

## 2015-10-13 DIAGNOSIS — Z6832 Body mass index (BMI) 32.0-32.9, adult: Secondary | ICD-10-CM | POA: Diagnosis not present

## 2015-10-13 DIAGNOSIS — E669 Obesity, unspecified: Secondary | ICD-10-CM | POA: Diagnosis not present

## 2015-10-13 DIAGNOSIS — F329 Major depressive disorder, single episode, unspecified: Secondary | ICD-10-CM | POA: Diagnosis not present

## 2015-10-20 DIAGNOSIS — E7800 Pure hypercholesterolemia, unspecified: Secondary | ICD-10-CM | POA: Diagnosis not present

## 2015-10-20 DIAGNOSIS — I2510 Atherosclerotic heart disease of native coronary artery without angina pectoris: Secondary | ICD-10-CM | POA: Diagnosis not present

## 2015-10-20 DIAGNOSIS — I739 Peripheral vascular disease, unspecified: Secondary | ICD-10-CM | POA: Diagnosis not present

## 2015-10-20 DIAGNOSIS — I10 Essential (primary) hypertension: Secondary | ICD-10-CM | POA: Diagnosis not present

## 2015-10-20 DIAGNOSIS — M109 Gout, unspecified: Secondary | ICD-10-CM | POA: Diagnosis not present

## 2015-10-20 DIAGNOSIS — F329 Major depressive disorder, single episode, unspecified: Secondary | ICD-10-CM | POA: Diagnosis not present

## 2015-10-20 DIAGNOSIS — Z6832 Body mass index (BMI) 32.0-32.9, adult: Secondary | ICD-10-CM | POA: Diagnosis not present

## 2015-10-20 DIAGNOSIS — K219 Gastro-esophageal reflux disease without esophagitis: Secondary | ICD-10-CM | POA: Diagnosis not present

## 2015-10-20 DIAGNOSIS — F419 Anxiety disorder, unspecified: Secondary | ICD-10-CM | POA: Diagnosis not present

## 2015-10-20 DIAGNOSIS — E1159 Type 2 diabetes mellitus with other circulatory complications: Secondary | ICD-10-CM | POA: Diagnosis not present

## 2015-11-10 DIAGNOSIS — F419 Anxiety disorder, unspecified: Secondary | ICD-10-CM | POA: Diagnosis not present

## 2015-11-10 DIAGNOSIS — I10 Essential (primary) hypertension: Secondary | ICD-10-CM | POA: Diagnosis not present

## 2015-11-10 DIAGNOSIS — F329 Major depressive disorder, single episode, unspecified: Secondary | ICD-10-CM | POA: Diagnosis not present

## 2015-11-10 DIAGNOSIS — I2510 Atherosclerotic heart disease of native coronary artery without angina pectoris: Secondary | ICD-10-CM | POA: Diagnosis not present

## 2015-11-10 DIAGNOSIS — E7800 Pure hypercholesterolemia, unspecified: Secondary | ICD-10-CM | POA: Diagnosis not present

## 2015-11-10 DIAGNOSIS — E669 Obesity, unspecified: Secondary | ICD-10-CM | POA: Diagnosis not present

## 2015-11-10 DIAGNOSIS — K219 Gastro-esophageal reflux disease without esophagitis: Secondary | ICD-10-CM | POA: Diagnosis not present

## 2015-11-10 DIAGNOSIS — Z6832 Body mass index (BMI) 32.0-32.9, adult: Secondary | ICD-10-CM | POA: Diagnosis not present

## 2015-11-10 DIAGNOSIS — E1159 Type 2 diabetes mellitus with other circulatory complications: Secondary | ICD-10-CM | POA: Diagnosis not present

## 2015-11-10 DIAGNOSIS — M109 Gout, unspecified: Secondary | ICD-10-CM | POA: Diagnosis not present

## 2015-11-10 DIAGNOSIS — I739 Peripheral vascular disease, unspecified: Secondary | ICD-10-CM | POA: Diagnosis not present

## 2015-11-17 DIAGNOSIS — Z1231 Encounter for screening mammogram for malignant neoplasm of breast: Secondary | ICD-10-CM | POA: Diagnosis not present

## 2015-12-15 DIAGNOSIS — K430 Incisional hernia with obstruction, without gangrene: Secondary | ICD-10-CM | POA: Diagnosis not present

## 2015-12-15 DIAGNOSIS — R1032 Left lower quadrant pain: Secondary | ICD-10-CM | POA: Diagnosis not present

## 2015-12-15 DIAGNOSIS — E119 Type 2 diabetes mellitus without complications: Secondary | ICD-10-CM | POA: Diagnosis not present

## 2015-12-15 DIAGNOSIS — R110 Nausea: Secondary | ICD-10-CM | POA: Diagnosis not present

## 2016-01-13 DIAGNOSIS — M7702 Medial epicondylitis, left elbow: Secondary | ICD-10-CM | POA: Diagnosis not present

## 2016-01-13 DIAGNOSIS — F329 Major depressive disorder, single episode, unspecified: Secondary | ICD-10-CM | POA: Diagnosis not present

## 2016-01-13 DIAGNOSIS — E1159 Type 2 diabetes mellitus with other circulatory complications: Secondary | ICD-10-CM | POA: Diagnosis not present

## 2016-01-13 DIAGNOSIS — F419 Anxiety disorder, unspecified: Secondary | ICD-10-CM | POA: Diagnosis not present

## 2016-01-13 DIAGNOSIS — I10 Essential (primary) hypertension: Secondary | ICD-10-CM | POA: Diagnosis not present

## 2016-01-13 DIAGNOSIS — E669 Obesity, unspecified: Secondary | ICD-10-CM | POA: Diagnosis not present

## 2016-01-13 DIAGNOSIS — I2510 Atherosclerotic heart disease of native coronary artery without angina pectoris: Secondary | ICD-10-CM | POA: Diagnosis not present

## 2016-01-13 DIAGNOSIS — E7800 Pure hypercholesterolemia, unspecified: Secondary | ICD-10-CM | POA: Diagnosis not present

## 2016-01-13 DIAGNOSIS — K219 Gastro-esophageal reflux disease without esophagitis: Secondary | ICD-10-CM | POA: Diagnosis not present

## 2016-01-13 DIAGNOSIS — Z6830 Body mass index (BMI) 30.0-30.9, adult: Secondary | ICD-10-CM | POA: Diagnosis not present

## 2016-01-13 DIAGNOSIS — I739 Peripheral vascular disease, unspecified: Secondary | ICD-10-CM | POA: Diagnosis not present

## 2016-01-13 DIAGNOSIS — M109 Gout, unspecified: Secondary | ICD-10-CM | POA: Diagnosis not present

## 2016-01-19 DIAGNOSIS — I10 Essential (primary) hypertension: Secondary | ICD-10-CM | POA: Diagnosis not present

## 2016-01-19 DIAGNOSIS — E119 Type 2 diabetes mellitus without complications: Secondary | ICD-10-CM | POA: Diagnosis not present

## 2016-01-19 DIAGNOSIS — R1032 Left lower quadrant pain: Secondary | ICD-10-CM | POA: Diagnosis not present

## 2016-01-19 DIAGNOSIS — K430 Incisional hernia with obstruction, without gangrene: Secondary | ICD-10-CM | POA: Diagnosis not present

## 2016-01-27 DIAGNOSIS — I739 Peripheral vascular disease, unspecified: Secondary | ICD-10-CM | POA: Diagnosis not present

## 2016-01-27 DIAGNOSIS — F329 Major depressive disorder, single episode, unspecified: Secondary | ICD-10-CM | POA: Diagnosis not present

## 2016-01-27 DIAGNOSIS — I10 Essential (primary) hypertension: Secondary | ICD-10-CM | POA: Diagnosis not present

## 2016-01-27 DIAGNOSIS — E7800 Pure hypercholesterolemia, unspecified: Secondary | ICD-10-CM | POA: Diagnosis not present

## 2016-01-27 DIAGNOSIS — E1159 Type 2 diabetes mellitus with other circulatory complications: Secondary | ICD-10-CM | POA: Diagnosis not present

## 2016-01-27 DIAGNOSIS — I2510 Atherosclerotic heart disease of native coronary artery without angina pectoris: Secondary | ICD-10-CM | POA: Diagnosis not present

## 2016-01-27 DIAGNOSIS — M109 Gout, unspecified: Secondary | ICD-10-CM | POA: Diagnosis not present

## 2016-01-27 DIAGNOSIS — M25562 Pain in left knee: Secondary | ICD-10-CM | POA: Diagnosis not present

## 2016-01-27 DIAGNOSIS — Z1159 Encounter for screening for other viral diseases: Secondary | ICD-10-CM | POA: Diagnosis not present

## 2016-01-27 DIAGNOSIS — F419 Anxiety disorder, unspecified: Secondary | ICD-10-CM | POA: Diagnosis not present

## 2016-01-27 DIAGNOSIS — K219 Gastro-esophageal reflux disease without esophagitis: Secondary | ICD-10-CM | POA: Diagnosis not present

## 2016-01-27 DIAGNOSIS — E663 Overweight: Secondary | ICD-10-CM | POA: Diagnosis not present

## 2016-02-03 DIAGNOSIS — Z01818 Encounter for other preprocedural examination: Secondary | ICD-10-CM | POA: Diagnosis not present

## 2016-02-10 DIAGNOSIS — K430 Incisional hernia with obstruction, without gangrene: Secondary | ICD-10-CM | POA: Diagnosis not present

## 2016-03-08 DIAGNOSIS — I10 Essential (primary) hypertension: Secondary | ICD-10-CM | POA: Diagnosis not present

## 2016-03-08 DIAGNOSIS — I2510 Atherosclerotic heart disease of native coronary artery without angina pectoris: Secondary | ICD-10-CM | POA: Diagnosis not present

## 2016-03-08 DIAGNOSIS — F419 Anxiety disorder, unspecified: Secondary | ICD-10-CM | POA: Diagnosis not present

## 2016-03-08 DIAGNOSIS — Z6827 Body mass index (BMI) 27.0-27.9, adult: Secondary | ICD-10-CM | POA: Diagnosis not present

## 2016-03-08 DIAGNOSIS — I739 Peripheral vascular disease, unspecified: Secondary | ICD-10-CM | POA: Diagnosis not present

## 2016-03-08 DIAGNOSIS — E663 Overweight: Secondary | ICD-10-CM | POA: Diagnosis not present

## 2016-03-08 DIAGNOSIS — E1159 Type 2 diabetes mellitus with other circulatory complications: Secondary | ICD-10-CM | POA: Diagnosis not present

## 2016-03-08 DIAGNOSIS — M109 Gout, unspecified: Secondary | ICD-10-CM | POA: Diagnosis not present

## 2016-03-08 DIAGNOSIS — F329 Major depressive disorder, single episode, unspecified: Secondary | ICD-10-CM | POA: Diagnosis not present

## 2016-03-08 DIAGNOSIS — K219 Gastro-esophageal reflux disease without esophagitis: Secondary | ICD-10-CM | POA: Diagnosis not present

## 2016-03-08 DIAGNOSIS — E7800 Pure hypercholesterolemia, unspecified: Secondary | ICD-10-CM | POA: Diagnosis not present

## 2016-03-16 DIAGNOSIS — E119 Type 2 diabetes mellitus without complications: Secondary | ICD-10-CM | POA: Diagnosis not present

## 2016-03-16 DIAGNOSIS — I25119 Atherosclerotic heart disease of native coronary artery with unspecified angina pectoris: Secondary | ICD-10-CM | POA: Diagnosis not present

## 2016-03-16 DIAGNOSIS — H54414A Blindness right eye category 4, normal vision left eye: Secondary | ICD-10-CM | POA: Diagnosis not present

## 2016-03-16 DIAGNOSIS — I10 Essential (primary) hypertension: Secondary | ICD-10-CM | POA: Diagnosis not present

## 2016-03-16 DIAGNOSIS — T8130XD Disruption of wound, unspecified, subsequent encounter: Secondary | ICD-10-CM | POA: Diagnosis not present

## 2016-03-16 DIAGNOSIS — I69398 Other sequelae of cerebral infarction: Secondary | ICD-10-CM | POA: Diagnosis not present

## 2016-03-17 DIAGNOSIS — I10 Essential (primary) hypertension: Secondary | ICD-10-CM | POA: Diagnosis not present

## 2016-03-17 DIAGNOSIS — H54414A Blindness right eye category 4, normal vision left eye: Secondary | ICD-10-CM | POA: Diagnosis not present

## 2016-03-17 DIAGNOSIS — I25119 Atherosclerotic heart disease of native coronary artery with unspecified angina pectoris: Secondary | ICD-10-CM | POA: Diagnosis not present

## 2016-03-17 DIAGNOSIS — E119 Type 2 diabetes mellitus without complications: Secondary | ICD-10-CM | POA: Diagnosis not present

## 2016-03-17 DIAGNOSIS — I69398 Other sequelae of cerebral infarction: Secondary | ICD-10-CM | POA: Diagnosis not present

## 2016-03-17 DIAGNOSIS — T8130XD Disruption of wound, unspecified, subsequent encounter: Secondary | ICD-10-CM | POA: Diagnosis not present

## 2016-03-19 DIAGNOSIS — T8130XD Disruption of wound, unspecified, subsequent encounter: Secondary | ICD-10-CM | POA: Diagnosis not present

## 2016-03-19 DIAGNOSIS — I10 Essential (primary) hypertension: Secondary | ICD-10-CM | POA: Diagnosis not present

## 2016-03-19 DIAGNOSIS — E119 Type 2 diabetes mellitus without complications: Secondary | ICD-10-CM | POA: Diagnosis not present

## 2016-03-19 DIAGNOSIS — H54414A Blindness right eye category 4, normal vision left eye: Secondary | ICD-10-CM | POA: Diagnosis not present

## 2016-03-19 DIAGNOSIS — I69398 Other sequelae of cerebral infarction: Secondary | ICD-10-CM | POA: Diagnosis not present

## 2016-03-19 DIAGNOSIS — I25119 Atherosclerotic heart disease of native coronary artery with unspecified angina pectoris: Secondary | ICD-10-CM | POA: Diagnosis not present

## 2016-03-21 DIAGNOSIS — I25119 Atherosclerotic heart disease of native coronary artery with unspecified angina pectoris: Secondary | ICD-10-CM | POA: Diagnosis not present

## 2016-03-21 DIAGNOSIS — H54414A Blindness right eye category 4, normal vision left eye: Secondary | ICD-10-CM | POA: Diagnosis not present

## 2016-03-21 DIAGNOSIS — I69398 Other sequelae of cerebral infarction: Secondary | ICD-10-CM | POA: Diagnosis not present

## 2016-03-21 DIAGNOSIS — T8130XD Disruption of wound, unspecified, subsequent encounter: Secondary | ICD-10-CM | POA: Diagnosis not present

## 2016-03-21 DIAGNOSIS — I10 Essential (primary) hypertension: Secondary | ICD-10-CM | POA: Diagnosis not present

## 2016-03-21 DIAGNOSIS — E119 Type 2 diabetes mellitus without complications: Secondary | ICD-10-CM | POA: Diagnosis not present

## 2016-03-23 DIAGNOSIS — I10 Essential (primary) hypertension: Secondary | ICD-10-CM | POA: Diagnosis not present

## 2016-03-23 DIAGNOSIS — I69398 Other sequelae of cerebral infarction: Secondary | ICD-10-CM | POA: Diagnosis not present

## 2016-03-23 DIAGNOSIS — H54414A Blindness right eye category 4, normal vision left eye: Secondary | ICD-10-CM | POA: Diagnosis not present

## 2016-03-23 DIAGNOSIS — I25119 Atherosclerotic heart disease of native coronary artery with unspecified angina pectoris: Secondary | ICD-10-CM | POA: Diagnosis not present

## 2016-03-23 DIAGNOSIS — T8130XD Disruption of wound, unspecified, subsequent encounter: Secondary | ICD-10-CM | POA: Diagnosis not present

## 2016-03-23 DIAGNOSIS — E119 Type 2 diabetes mellitus without complications: Secondary | ICD-10-CM | POA: Diagnosis not present

## 2016-03-25 DIAGNOSIS — E119 Type 2 diabetes mellitus without complications: Secondary | ICD-10-CM | POA: Diagnosis not present

## 2016-03-25 DIAGNOSIS — H54414A Blindness right eye category 4, normal vision left eye: Secondary | ICD-10-CM | POA: Diagnosis not present

## 2016-03-25 DIAGNOSIS — I25119 Atherosclerotic heart disease of native coronary artery with unspecified angina pectoris: Secondary | ICD-10-CM | POA: Diagnosis not present

## 2016-03-25 DIAGNOSIS — I69398 Other sequelae of cerebral infarction: Secondary | ICD-10-CM | POA: Diagnosis not present

## 2016-03-25 DIAGNOSIS — T8130XD Disruption of wound, unspecified, subsequent encounter: Secondary | ICD-10-CM | POA: Diagnosis not present

## 2016-03-25 DIAGNOSIS — I10 Essential (primary) hypertension: Secondary | ICD-10-CM | POA: Diagnosis not present

## 2016-03-28 DIAGNOSIS — I25119 Atherosclerotic heart disease of native coronary artery with unspecified angina pectoris: Secondary | ICD-10-CM | POA: Diagnosis not present

## 2016-03-28 DIAGNOSIS — I69398 Other sequelae of cerebral infarction: Secondary | ICD-10-CM | POA: Diagnosis not present

## 2016-03-28 DIAGNOSIS — E119 Type 2 diabetes mellitus without complications: Secondary | ICD-10-CM | POA: Diagnosis not present

## 2016-03-28 DIAGNOSIS — T8130XD Disruption of wound, unspecified, subsequent encounter: Secondary | ICD-10-CM | POA: Diagnosis not present

## 2016-03-28 DIAGNOSIS — I10 Essential (primary) hypertension: Secondary | ICD-10-CM | POA: Diagnosis not present

## 2016-03-28 DIAGNOSIS — H54414A Blindness right eye category 4, normal vision left eye: Secondary | ICD-10-CM | POA: Diagnosis not present

## 2016-03-29 DIAGNOSIS — I25119 Atherosclerotic heart disease of native coronary artery with unspecified angina pectoris: Secondary | ICD-10-CM | POA: Diagnosis not present

## 2016-03-29 DIAGNOSIS — E119 Type 2 diabetes mellitus without complications: Secondary | ICD-10-CM | POA: Diagnosis not present

## 2016-03-29 DIAGNOSIS — I69398 Other sequelae of cerebral infarction: Secondary | ICD-10-CM | POA: Diagnosis not present

## 2016-03-29 DIAGNOSIS — I10 Essential (primary) hypertension: Secondary | ICD-10-CM | POA: Diagnosis not present

## 2016-03-29 DIAGNOSIS — T8130XD Disruption of wound, unspecified, subsequent encounter: Secondary | ICD-10-CM | POA: Diagnosis not present

## 2016-03-29 DIAGNOSIS — H54414A Blindness right eye category 4, normal vision left eye: Secondary | ICD-10-CM | POA: Diagnosis not present

## 2016-03-30 DIAGNOSIS — E119 Type 2 diabetes mellitus without complications: Secondary | ICD-10-CM | POA: Diagnosis not present

## 2016-03-30 DIAGNOSIS — T8130XD Disruption of wound, unspecified, subsequent encounter: Secondary | ICD-10-CM | POA: Diagnosis not present

## 2016-03-30 DIAGNOSIS — I25119 Atherosclerotic heart disease of native coronary artery with unspecified angina pectoris: Secondary | ICD-10-CM | POA: Diagnosis not present

## 2016-03-30 DIAGNOSIS — H54414A Blindness right eye category 4, normal vision left eye: Secondary | ICD-10-CM | POA: Diagnosis not present

## 2016-03-30 DIAGNOSIS — I69398 Other sequelae of cerebral infarction: Secondary | ICD-10-CM | POA: Diagnosis not present

## 2016-03-30 DIAGNOSIS — I10 Essential (primary) hypertension: Secondary | ICD-10-CM | POA: Diagnosis not present

## 2016-03-31 DIAGNOSIS — E119 Type 2 diabetes mellitus without complications: Secondary | ICD-10-CM | POA: Diagnosis not present

## 2016-03-31 DIAGNOSIS — I69398 Other sequelae of cerebral infarction: Secondary | ICD-10-CM | POA: Diagnosis not present

## 2016-03-31 DIAGNOSIS — K430 Incisional hernia with obstruction, without gangrene: Secondary | ICD-10-CM | POA: Diagnosis not present

## 2016-03-31 DIAGNOSIS — H54414A Blindness right eye category 4, normal vision left eye: Secondary | ICD-10-CM | POA: Diagnosis not present

## 2016-03-31 DIAGNOSIS — I10 Essential (primary) hypertension: Secondary | ICD-10-CM | POA: Diagnosis not present

## 2016-03-31 DIAGNOSIS — I25119 Atherosclerotic heart disease of native coronary artery with unspecified angina pectoris: Secondary | ICD-10-CM | POA: Diagnosis not present

## 2016-03-31 DIAGNOSIS — R1032 Left lower quadrant pain: Secondary | ICD-10-CM | POA: Diagnosis not present

## 2016-03-31 DIAGNOSIS — T8130XD Disruption of wound, unspecified, subsequent encounter: Secondary | ICD-10-CM | POA: Diagnosis not present

## 2016-04-01 DIAGNOSIS — I69398 Other sequelae of cerebral infarction: Secondary | ICD-10-CM | POA: Diagnosis not present

## 2016-04-01 DIAGNOSIS — E119 Type 2 diabetes mellitus without complications: Secondary | ICD-10-CM | POA: Diagnosis not present

## 2016-04-01 DIAGNOSIS — I10 Essential (primary) hypertension: Secondary | ICD-10-CM | POA: Diagnosis not present

## 2016-04-01 DIAGNOSIS — H54414A Blindness right eye category 4, normal vision left eye: Secondary | ICD-10-CM | POA: Diagnosis not present

## 2016-04-01 DIAGNOSIS — I25119 Atherosclerotic heart disease of native coronary artery with unspecified angina pectoris: Secondary | ICD-10-CM | POA: Diagnosis not present

## 2016-04-01 DIAGNOSIS — T8130XD Disruption of wound, unspecified, subsequent encounter: Secondary | ICD-10-CM | POA: Diagnosis not present

## 2016-04-02 DIAGNOSIS — I10 Essential (primary) hypertension: Secondary | ICD-10-CM | POA: Diagnosis not present

## 2016-04-02 DIAGNOSIS — I69398 Other sequelae of cerebral infarction: Secondary | ICD-10-CM | POA: Diagnosis not present

## 2016-04-02 DIAGNOSIS — I25119 Atherosclerotic heart disease of native coronary artery with unspecified angina pectoris: Secondary | ICD-10-CM | POA: Diagnosis not present

## 2016-04-02 DIAGNOSIS — E119 Type 2 diabetes mellitus without complications: Secondary | ICD-10-CM | POA: Diagnosis not present

## 2016-04-02 DIAGNOSIS — T8130XD Disruption of wound, unspecified, subsequent encounter: Secondary | ICD-10-CM | POA: Diagnosis not present

## 2016-04-02 DIAGNOSIS — H54414A Blindness right eye category 4, normal vision left eye: Secondary | ICD-10-CM | POA: Diagnosis not present

## 2016-04-03 DIAGNOSIS — I25119 Atherosclerotic heart disease of native coronary artery with unspecified angina pectoris: Secondary | ICD-10-CM | POA: Diagnosis not present

## 2016-04-03 DIAGNOSIS — T8130XD Disruption of wound, unspecified, subsequent encounter: Secondary | ICD-10-CM | POA: Diagnosis not present

## 2016-04-03 DIAGNOSIS — I69398 Other sequelae of cerebral infarction: Secondary | ICD-10-CM | POA: Diagnosis not present

## 2016-04-03 DIAGNOSIS — H54414A Blindness right eye category 4, normal vision left eye: Secondary | ICD-10-CM | POA: Diagnosis not present

## 2016-04-03 DIAGNOSIS — I10 Essential (primary) hypertension: Secondary | ICD-10-CM | POA: Diagnosis not present

## 2016-04-03 DIAGNOSIS — E119 Type 2 diabetes mellitus without complications: Secondary | ICD-10-CM | POA: Diagnosis not present

## 2016-04-04 DIAGNOSIS — E119 Type 2 diabetes mellitus without complications: Secondary | ICD-10-CM | POA: Diagnosis not present

## 2016-04-04 DIAGNOSIS — I25119 Atherosclerotic heart disease of native coronary artery with unspecified angina pectoris: Secondary | ICD-10-CM | POA: Diagnosis not present

## 2016-04-04 DIAGNOSIS — T8130XD Disruption of wound, unspecified, subsequent encounter: Secondary | ICD-10-CM | POA: Diagnosis not present

## 2016-04-04 DIAGNOSIS — H54414A Blindness right eye category 4, normal vision left eye: Secondary | ICD-10-CM | POA: Diagnosis not present

## 2016-04-04 DIAGNOSIS — I69398 Other sequelae of cerebral infarction: Secondary | ICD-10-CM | POA: Diagnosis not present

## 2016-04-04 DIAGNOSIS — I10 Essential (primary) hypertension: Secondary | ICD-10-CM | POA: Diagnosis not present

## 2016-04-06 DIAGNOSIS — T8130XD Disruption of wound, unspecified, subsequent encounter: Secondary | ICD-10-CM | POA: Diagnosis not present

## 2016-04-06 DIAGNOSIS — I10 Essential (primary) hypertension: Secondary | ICD-10-CM | POA: Diagnosis not present

## 2016-04-06 DIAGNOSIS — H54414A Blindness right eye category 4, normal vision left eye: Secondary | ICD-10-CM | POA: Diagnosis not present

## 2016-04-06 DIAGNOSIS — I69398 Other sequelae of cerebral infarction: Secondary | ICD-10-CM | POA: Diagnosis not present

## 2016-04-06 DIAGNOSIS — E119 Type 2 diabetes mellitus without complications: Secondary | ICD-10-CM | POA: Diagnosis not present

## 2016-04-06 DIAGNOSIS — I25119 Atherosclerotic heart disease of native coronary artery with unspecified angina pectoris: Secondary | ICD-10-CM | POA: Diagnosis not present

## 2016-04-08 DIAGNOSIS — E119 Type 2 diabetes mellitus without complications: Secondary | ICD-10-CM | POA: Diagnosis not present

## 2016-04-08 DIAGNOSIS — I69398 Other sequelae of cerebral infarction: Secondary | ICD-10-CM | POA: Diagnosis not present

## 2016-04-08 DIAGNOSIS — T8130XD Disruption of wound, unspecified, subsequent encounter: Secondary | ICD-10-CM | POA: Diagnosis not present

## 2016-04-08 DIAGNOSIS — I25119 Atherosclerotic heart disease of native coronary artery with unspecified angina pectoris: Secondary | ICD-10-CM | POA: Diagnosis not present

## 2016-04-08 DIAGNOSIS — H54414A Blindness right eye category 4, normal vision left eye: Secondary | ICD-10-CM | POA: Diagnosis not present

## 2016-04-08 DIAGNOSIS — I10 Essential (primary) hypertension: Secondary | ICD-10-CM | POA: Diagnosis not present

## 2016-04-11 DIAGNOSIS — I25119 Atherosclerotic heart disease of native coronary artery with unspecified angina pectoris: Secondary | ICD-10-CM | POA: Diagnosis not present

## 2016-04-11 DIAGNOSIS — Z23 Encounter for immunization: Secondary | ICD-10-CM | POA: Diagnosis not present

## 2016-04-11 DIAGNOSIS — E119 Type 2 diabetes mellitus without complications: Secondary | ICD-10-CM | POA: Diagnosis not present

## 2016-04-11 DIAGNOSIS — H54414A Blindness right eye category 4, normal vision left eye: Secondary | ICD-10-CM | POA: Diagnosis not present

## 2016-04-11 DIAGNOSIS — T8130XD Disruption of wound, unspecified, subsequent encounter: Secondary | ICD-10-CM | POA: Diagnosis not present

## 2016-04-11 DIAGNOSIS — I10 Essential (primary) hypertension: Secondary | ICD-10-CM | POA: Diagnosis not present

## 2016-04-11 DIAGNOSIS — I69398 Other sequelae of cerebral infarction: Secondary | ICD-10-CM | POA: Diagnosis not present

## 2016-04-13 DIAGNOSIS — I10 Essential (primary) hypertension: Secondary | ICD-10-CM | POA: Diagnosis not present

## 2016-04-13 DIAGNOSIS — I25119 Atherosclerotic heart disease of native coronary artery with unspecified angina pectoris: Secondary | ICD-10-CM | POA: Diagnosis not present

## 2016-04-13 DIAGNOSIS — E119 Type 2 diabetes mellitus without complications: Secondary | ICD-10-CM | POA: Diagnosis not present

## 2016-04-13 DIAGNOSIS — T8130XD Disruption of wound, unspecified, subsequent encounter: Secondary | ICD-10-CM | POA: Diagnosis not present

## 2016-04-13 DIAGNOSIS — I69398 Other sequelae of cerebral infarction: Secondary | ICD-10-CM | POA: Diagnosis not present

## 2016-04-13 DIAGNOSIS — H54414A Blindness right eye category 4, normal vision left eye: Secondary | ICD-10-CM | POA: Diagnosis not present

## 2016-04-14 DIAGNOSIS — F329 Major depressive disorder, single episode, unspecified: Secondary | ICD-10-CM | POA: Diagnosis not present

## 2016-04-14 DIAGNOSIS — F419 Anxiety disorder, unspecified: Secondary | ICD-10-CM | POA: Diagnosis not present

## 2016-04-14 DIAGNOSIS — K219 Gastro-esophageal reflux disease without esophagitis: Secondary | ICD-10-CM | POA: Diagnosis not present

## 2016-04-14 DIAGNOSIS — Z79899 Other long term (current) drug therapy: Secondary | ICD-10-CM | POA: Diagnosis not present

## 2016-04-14 DIAGNOSIS — I2510 Atherosclerotic heart disease of native coronary artery without angina pectoris: Secondary | ICD-10-CM | POA: Diagnosis not present

## 2016-04-14 DIAGNOSIS — I10 Essential (primary) hypertension: Secondary | ICD-10-CM | POA: Diagnosis not present

## 2016-04-14 DIAGNOSIS — E1159 Type 2 diabetes mellitus with other circulatory complications: Secondary | ICD-10-CM | POA: Diagnosis not present

## 2016-04-14 DIAGNOSIS — I739 Peripheral vascular disease, unspecified: Secondary | ICD-10-CM | POA: Diagnosis not present

## 2016-04-14 DIAGNOSIS — E7800 Pure hypercholesterolemia, unspecified: Secondary | ICD-10-CM | POA: Diagnosis not present

## 2016-04-14 DIAGNOSIS — M109 Gout, unspecified: Secondary | ICD-10-CM | POA: Diagnosis not present

## 2016-04-14 DIAGNOSIS — Z6828 Body mass index (BMI) 28.0-28.9, adult: Secondary | ICD-10-CM | POA: Diagnosis not present

## 2016-04-14 DIAGNOSIS — E663 Overweight: Secondary | ICD-10-CM | POA: Diagnosis not present

## 2016-04-15 DIAGNOSIS — H54414A Blindness right eye category 4, normal vision left eye: Secondary | ICD-10-CM | POA: Diagnosis not present

## 2016-04-15 DIAGNOSIS — E119 Type 2 diabetes mellitus without complications: Secondary | ICD-10-CM | POA: Diagnosis not present

## 2016-04-15 DIAGNOSIS — I69398 Other sequelae of cerebral infarction: Secondary | ICD-10-CM | POA: Diagnosis not present

## 2016-04-15 DIAGNOSIS — T8130XD Disruption of wound, unspecified, subsequent encounter: Secondary | ICD-10-CM | POA: Diagnosis not present

## 2016-04-15 DIAGNOSIS — I25119 Atherosclerotic heart disease of native coronary artery with unspecified angina pectoris: Secondary | ICD-10-CM | POA: Diagnosis not present

## 2016-04-15 DIAGNOSIS — I10 Essential (primary) hypertension: Secondary | ICD-10-CM | POA: Diagnosis not present

## 2016-04-18 DIAGNOSIS — I25119 Atherosclerotic heart disease of native coronary artery with unspecified angina pectoris: Secondary | ICD-10-CM | POA: Diagnosis not present

## 2016-04-18 DIAGNOSIS — I69398 Other sequelae of cerebral infarction: Secondary | ICD-10-CM | POA: Diagnosis not present

## 2016-04-18 DIAGNOSIS — I10 Essential (primary) hypertension: Secondary | ICD-10-CM | POA: Diagnosis not present

## 2016-04-18 DIAGNOSIS — T8130XD Disruption of wound, unspecified, subsequent encounter: Secondary | ICD-10-CM | POA: Diagnosis not present

## 2016-04-18 DIAGNOSIS — H54414A Blindness right eye category 4, normal vision left eye: Secondary | ICD-10-CM | POA: Diagnosis not present

## 2016-04-18 DIAGNOSIS — E119 Type 2 diabetes mellitus without complications: Secondary | ICD-10-CM | POA: Diagnosis not present

## 2016-04-20 DIAGNOSIS — I25119 Atherosclerotic heart disease of native coronary artery with unspecified angina pectoris: Secondary | ICD-10-CM | POA: Diagnosis not present

## 2016-04-20 DIAGNOSIS — I10 Essential (primary) hypertension: Secondary | ICD-10-CM | POA: Diagnosis not present

## 2016-04-20 DIAGNOSIS — T8130XD Disruption of wound, unspecified, subsequent encounter: Secondary | ICD-10-CM | POA: Diagnosis not present

## 2016-04-20 DIAGNOSIS — I69398 Other sequelae of cerebral infarction: Secondary | ICD-10-CM | POA: Diagnosis not present

## 2016-04-20 DIAGNOSIS — H54414A Blindness right eye category 4, normal vision left eye: Secondary | ICD-10-CM | POA: Diagnosis not present

## 2016-04-20 DIAGNOSIS — E119 Type 2 diabetes mellitus without complications: Secondary | ICD-10-CM | POA: Diagnosis not present

## 2016-04-22 DIAGNOSIS — E119 Type 2 diabetes mellitus without complications: Secondary | ICD-10-CM | POA: Diagnosis not present

## 2016-04-22 DIAGNOSIS — H54414A Blindness right eye category 4, normal vision left eye: Secondary | ICD-10-CM | POA: Diagnosis not present

## 2016-04-22 DIAGNOSIS — I10 Essential (primary) hypertension: Secondary | ICD-10-CM | POA: Diagnosis not present

## 2016-04-22 DIAGNOSIS — I69398 Other sequelae of cerebral infarction: Secondary | ICD-10-CM | POA: Diagnosis not present

## 2016-04-22 DIAGNOSIS — T8130XD Disruption of wound, unspecified, subsequent encounter: Secondary | ICD-10-CM | POA: Diagnosis not present

## 2016-04-22 DIAGNOSIS — I25119 Atherosclerotic heart disease of native coronary artery with unspecified angina pectoris: Secondary | ICD-10-CM | POA: Diagnosis not present

## 2016-04-25 DIAGNOSIS — I69398 Other sequelae of cerebral infarction: Secondary | ICD-10-CM | POA: Diagnosis not present

## 2016-04-25 DIAGNOSIS — E119 Type 2 diabetes mellitus without complications: Secondary | ICD-10-CM | POA: Diagnosis not present

## 2016-04-25 DIAGNOSIS — H54414A Blindness right eye category 4, normal vision left eye: Secondary | ICD-10-CM | POA: Diagnosis not present

## 2016-04-25 DIAGNOSIS — T8130XD Disruption of wound, unspecified, subsequent encounter: Secondary | ICD-10-CM | POA: Diagnosis not present

## 2016-04-25 DIAGNOSIS — I25119 Atherosclerotic heart disease of native coronary artery with unspecified angina pectoris: Secondary | ICD-10-CM | POA: Diagnosis not present

## 2016-04-25 DIAGNOSIS — I10 Essential (primary) hypertension: Secondary | ICD-10-CM | POA: Diagnosis not present

## 2016-04-26 DIAGNOSIS — E119 Type 2 diabetes mellitus without complications: Secondary | ICD-10-CM | POA: Diagnosis not present

## 2016-04-27 DIAGNOSIS — I25119 Atherosclerotic heart disease of native coronary artery with unspecified angina pectoris: Secondary | ICD-10-CM | POA: Diagnosis not present

## 2016-04-27 DIAGNOSIS — H54414A Blindness right eye category 4, normal vision left eye: Secondary | ICD-10-CM | POA: Diagnosis not present

## 2016-04-27 DIAGNOSIS — E119 Type 2 diabetes mellitus without complications: Secondary | ICD-10-CM | POA: Diagnosis not present

## 2016-04-27 DIAGNOSIS — I10 Essential (primary) hypertension: Secondary | ICD-10-CM | POA: Diagnosis not present

## 2016-04-27 DIAGNOSIS — T8130XD Disruption of wound, unspecified, subsequent encounter: Secondary | ICD-10-CM | POA: Diagnosis not present

## 2016-04-27 DIAGNOSIS — I69398 Other sequelae of cerebral infarction: Secondary | ICD-10-CM | POA: Diagnosis not present

## 2016-04-29 DIAGNOSIS — H54414A Blindness right eye category 4, normal vision left eye: Secondary | ICD-10-CM | POA: Diagnosis not present

## 2016-04-29 DIAGNOSIS — I25119 Atherosclerotic heart disease of native coronary artery with unspecified angina pectoris: Secondary | ICD-10-CM | POA: Diagnosis not present

## 2016-04-29 DIAGNOSIS — I10 Essential (primary) hypertension: Secondary | ICD-10-CM | POA: Diagnosis not present

## 2016-04-29 DIAGNOSIS — E119 Type 2 diabetes mellitus without complications: Secondary | ICD-10-CM | POA: Diagnosis not present

## 2016-04-29 DIAGNOSIS — T8130XD Disruption of wound, unspecified, subsequent encounter: Secondary | ICD-10-CM | POA: Diagnosis not present

## 2016-04-29 DIAGNOSIS — I69398 Other sequelae of cerebral infarction: Secondary | ICD-10-CM | POA: Diagnosis not present

## 2016-05-02 DIAGNOSIS — I10 Essential (primary) hypertension: Secondary | ICD-10-CM | POA: Diagnosis not present

## 2016-05-02 DIAGNOSIS — E119 Type 2 diabetes mellitus without complications: Secondary | ICD-10-CM | POA: Diagnosis not present

## 2016-05-02 DIAGNOSIS — H54414A Blindness right eye category 4, normal vision left eye: Secondary | ICD-10-CM | POA: Diagnosis not present

## 2016-05-02 DIAGNOSIS — T8130XD Disruption of wound, unspecified, subsequent encounter: Secondary | ICD-10-CM | POA: Diagnosis not present

## 2016-05-02 DIAGNOSIS — I25119 Atherosclerotic heart disease of native coronary artery with unspecified angina pectoris: Secondary | ICD-10-CM | POA: Diagnosis not present

## 2016-05-02 DIAGNOSIS — I69398 Other sequelae of cerebral infarction: Secondary | ICD-10-CM | POA: Diagnosis not present

## 2016-05-04 DIAGNOSIS — E119 Type 2 diabetes mellitus without complications: Secondary | ICD-10-CM | POA: Diagnosis not present

## 2016-05-04 DIAGNOSIS — T8130XD Disruption of wound, unspecified, subsequent encounter: Secondary | ICD-10-CM | POA: Diagnosis not present

## 2016-05-04 DIAGNOSIS — H54414A Blindness right eye category 4, normal vision left eye: Secondary | ICD-10-CM | POA: Diagnosis not present

## 2016-05-04 DIAGNOSIS — I69398 Other sequelae of cerebral infarction: Secondary | ICD-10-CM | POA: Diagnosis not present

## 2016-05-04 DIAGNOSIS — I10 Essential (primary) hypertension: Secondary | ICD-10-CM | POA: Diagnosis not present

## 2016-05-04 DIAGNOSIS — I25119 Atherosclerotic heart disease of native coronary artery with unspecified angina pectoris: Secondary | ICD-10-CM | POA: Diagnosis not present

## 2016-05-06 DIAGNOSIS — H54414A Blindness right eye category 4, normal vision left eye: Secondary | ICD-10-CM | POA: Diagnosis not present

## 2016-05-06 DIAGNOSIS — T8130XD Disruption of wound, unspecified, subsequent encounter: Secondary | ICD-10-CM | POA: Diagnosis not present

## 2016-05-06 DIAGNOSIS — E119 Type 2 diabetes mellitus without complications: Secondary | ICD-10-CM | POA: Diagnosis not present

## 2016-05-06 DIAGNOSIS — I69398 Other sequelae of cerebral infarction: Secondary | ICD-10-CM | POA: Diagnosis not present

## 2016-05-06 DIAGNOSIS — I10 Essential (primary) hypertension: Secondary | ICD-10-CM | POA: Diagnosis not present

## 2016-05-06 DIAGNOSIS — I25119 Atherosclerotic heart disease of native coronary artery with unspecified angina pectoris: Secondary | ICD-10-CM | POA: Diagnosis not present

## 2016-05-09 DIAGNOSIS — I25119 Atherosclerotic heart disease of native coronary artery with unspecified angina pectoris: Secondary | ICD-10-CM | POA: Diagnosis not present

## 2016-05-09 DIAGNOSIS — I10 Essential (primary) hypertension: Secondary | ICD-10-CM | POA: Diagnosis not present

## 2016-05-09 DIAGNOSIS — H54414A Blindness right eye category 4, normal vision left eye: Secondary | ICD-10-CM | POA: Diagnosis not present

## 2016-05-09 DIAGNOSIS — I69398 Other sequelae of cerebral infarction: Secondary | ICD-10-CM | POA: Diagnosis not present

## 2016-05-09 DIAGNOSIS — T8130XD Disruption of wound, unspecified, subsequent encounter: Secondary | ICD-10-CM | POA: Diagnosis not present

## 2016-05-09 DIAGNOSIS — E119 Type 2 diabetes mellitus without complications: Secondary | ICD-10-CM | POA: Diagnosis not present

## 2016-05-11 DIAGNOSIS — H54414A Blindness right eye category 4, normal vision left eye: Secondary | ICD-10-CM | POA: Diagnosis not present

## 2016-05-11 DIAGNOSIS — I25119 Atherosclerotic heart disease of native coronary artery with unspecified angina pectoris: Secondary | ICD-10-CM | POA: Diagnosis not present

## 2016-05-11 DIAGNOSIS — I69398 Other sequelae of cerebral infarction: Secondary | ICD-10-CM | POA: Diagnosis not present

## 2016-05-11 DIAGNOSIS — E119 Type 2 diabetes mellitus without complications: Secondary | ICD-10-CM | POA: Diagnosis not present

## 2016-05-11 DIAGNOSIS — I10 Essential (primary) hypertension: Secondary | ICD-10-CM | POA: Diagnosis not present

## 2016-05-11 DIAGNOSIS — T8130XD Disruption of wound, unspecified, subsequent encounter: Secondary | ICD-10-CM | POA: Diagnosis not present

## 2016-05-12 DIAGNOSIS — K219 Gastro-esophageal reflux disease without esophagitis: Secondary | ICD-10-CM | POA: Diagnosis not present

## 2016-05-12 DIAGNOSIS — E663 Overweight: Secondary | ICD-10-CM | POA: Diagnosis not present

## 2016-05-12 DIAGNOSIS — E119 Type 2 diabetes mellitus without complications: Secondary | ICD-10-CM | POA: Diagnosis not present

## 2016-05-12 DIAGNOSIS — I10 Essential (primary) hypertension: Secondary | ICD-10-CM | POA: Diagnosis not present

## 2016-05-12 DIAGNOSIS — E7800 Pure hypercholesterolemia, unspecified: Secondary | ICD-10-CM | POA: Diagnosis not present

## 2016-05-12 DIAGNOSIS — I2510 Atherosclerotic heart disease of native coronary artery without angina pectoris: Secondary | ICD-10-CM | POA: Diagnosis not present

## 2016-05-12 DIAGNOSIS — Z6829 Body mass index (BMI) 29.0-29.9, adult: Secondary | ICD-10-CM | POA: Diagnosis not present

## 2016-05-12 DIAGNOSIS — F329 Major depressive disorder, single episode, unspecified: Secondary | ICD-10-CM | POA: Diagnosis not present

## 2016-05-12 DIAGNOSIS — E1159 Type 2 diabetes mellitus with other circulatory complications: Secondary | ICD-10-CM | POA: Diagnosis not present

## 2016-05-12 DIAGNOSIS — F419 Anxiety disorder, unspecified: Secondary | ICD-10-CM | POA: Diagnosis not present

## 2016-05-12 DIAGNOSIS — M109 Gout, unspecified: Secondary | ICD-10-CM | POA: Diagnosis not present

## 2016-05-12 DIAGNOSIS — M1990 Unspecified osteoarthritis, unspecified site: Secondary | ICD-10-CM | POA: Diagnosis not present

## 2016-05-12 DIAGNOSIS — I739 Peripheral vascular disease, unspecified: Secondary | ICD-10-CM | POA: Diagnosis not present

## 2016-05-15 DIAGNOSIS — I10 Essential (primary) hypertension: Secondary | ICD-10-CM | POA: Diagnosis not present

## 2016-05-15 DIAGNOSIS — I25119 Atherosclerotic heart disease of native coronary artery with unspecified angina pectoris: Secondary | ICD-10-CM | POA: Diagnosis not present

## 2016-05-15 DIAGNOSIS — E119 Type 2 diabetes mellitus without complications: Secondary | ICD-10-CM | POA: Diagnosis not present

## 2016-05-15 DIAGNOSIS — T8130XD Disruption of wound, unspecified, subsequent encounter: Secondary | ICD-10-CM | POA: Diagnosis not present

## 2016-05-15 DIAGNOSIS — H54414A Blindness right eye category 4, normal vision left eye: Secondary | ICD-10-CM | POA: Diagnosis not present

## 2016-05-15 DIAGNOSIS — I69398 Other sequelae of cerebral infarction: Secondary | ICD-10-CM | POA: Diagnosis not present

## 2016-05-16 DIAGNOSIS — I10 Essential (primary) hypertension: Secondary | ICD-10-CM | POA: Diagnosis not present

## 2016-05-16 DIAGNOSIS — E119 Type 2 diabetes mellitus without complications: Secondary | ICD-10-CM | POA: Diagnosis not present

## 2016-05-16 DIAGNOSIS — I69398 Other sequelae of cerebral infarction: Secondary | ICD-10-CM | POA: Diagnosis not present

## 2016-05-16 DIAGNOSIS — T8130XD Disruption of wound, unspecified, subsequent encounter: Secondary | ICD-10-CM | POA: Diagnosis not present

## 2016-05-16 DIAGNOSIS — H54414A Blindness right eye category 4, normal vision left eye: Secondary | ICD-10-CM | POA: Diagnosis not present

## 2016-05-16 DIAGNOSIS — I25119 Atherosclerotic heart disease of native coronary artery with unspecified angina pectoris: Secondary | ICD-10-CM | POA: Diagnosis not present

## 2016-05-18 DIAGNOSIS — I69398 Other sequelae of cerebral infarction: Secondary | ICD-10-CM | POA: Diagnosis not present

## 2016-05-18 DIAGNOSIS — H54414A Blindness right eye category 4, normal vision left eye: Secondary | ICD-10-CM | POA: Diagnosis not present

## 2016-05-18 DIAGNOSIS — I10 Essential (primary) hypertension: Secondary | ICD-10-CM | POA: Diagnosis not present

## 2016-05-18 DIAGNOSIS — I25119 Atherosclerotic heart disease of native coronary artery with unspecified angina pectoris: Secondary | ICD-10-CM | POA: Diagnosis not present

## 2016-05-18 DIAGNOSIS — E119 Type 2 diabetes mellitus without complications: Secondary | ICD-10-CM | POA: Diagnosis not present

## 2016-05-18 DIAGNOSIS — T8130XD Disruption of wound, unspecified, subsequent encounter: Secondary | ICD-10-CM | POA: Diagnosis not present

## 2016-05-20 DIAGNOSIS — E119 Type 2 diabetes mellitus without complications: Secondary | ICD-10-CM | POA: Diagnosis not present

## 2016-05-20 DIAGNOSIS — I25119 Atherosclerotic heart disease of native coronary artery with unspecified angina pectoris: Secondary | ICD-10-CM | POA: Diagnosis not present

## 2016-05-20 DIAGNOSIS — I10 Essential (primary) hypertension: Secondary | ICD-10-CM | POA: Diagnosis not present

## 2016-05-20 DIAGNOSIS — T8130XD Disruption of wound, unspecified, subsequent encounter: Secondary | ICD-10-CM | POA: Diagnosis not present

## 2016-05-20 DIAGNOSIS — I69398 Other sequelae of cerebral infarction: Secondary | ICD-10-CM | POA: Diagnosis not present

## 2016-05-20 DIAGNOSIS — H54414A Blindness right eye category 4, normal vision left eye: Secondary | ICD-10-CM | POA: Diagnosis not present

## 2016-05-23 DIAGNOSIS — I69398 Other sequelae of cerebral infarction: Secondary | ICD-10-CM | POA: Diagnosis not present

## 2016-05-23 DIAGNOSIS — H54414A Blindness right eye category 4, normal vision left eye: Secondary | ICD-10-CM | POA: Diagnosis not present

## 2016-05-23 DIAGNOSIS — I10 Essential (primary) hypertension: Secondary | ICD-10-CM | POA: Diagnosis not present

## 2016-05-23 DIAGNOSIS — E119 Type 2 diabetes mellitus without complications: Secondary | ICD-10-CM | POA: Diagnosis not present

## 2016-05-23 DIAGNOSIS — I25119 Atherosclerotic heart disease of native coronary artery with unspecified angina pectoris: Secondary | ICD-10-CM | POA: Diagnosis not present

## 2016-05-23 DIAGNOSIS — T8130XD Disruption of wound, unspecified, subsequent encounter: Secondary | ICD-10-CM | POA: Diagnosis not present

## 2016-05-25 DIAGNOSIS — T8130XD Disruption of wound, unspecified, subsequent encounter: Secondary | ICD-10-CM | POA: Diagnosis not present

## 2016-05-25 DIAGNOSIS — I10 Essential (primary) hypertension: Secondary | ICD-10-CM | POA: Diagnosis not present

## 2016-05-25 DIAGNOSIS — I69398 Other sequelae of cerebral infarction: Secondary | ICD-10-CM | POA: Diagnosis not present

## 2016-05-25 DIAGNOSIS — H54414A Blindness right eye category 4, normal vision left eye: Secondary | ICD-10-CM | POA: Diagnosis not present

## 2016-05-25 DIAGNOSIS — E119 Type 2 diabetes mellitus without complications: Secondary | ICD-10-CM | POA: Diagnosis not present

## 2016-05-25 DIAGNOSIS — I25119 Atherosclerotic heart disease of native coronary artery with unspecified angina pectoris: Secondary | ICD-10-CM | POA: Diagnosis not present

## 2016-05-27 DIAGNOSIS — I10 Essential (primary) hypertension: Secondary | ICD-10-CM | POA: Diagnosis not present

## 2016-05-27 DIAGNOSIS — I25119 Atherosclerotic heart disease of native coronary artery with unspecified angina pectoris: Secondary | ICD-10-CM | POA: Diagnosis not present

## 2016-05-27 DIAGNOSIS — E119 Type 2 diabetes mellitus without complications: Secondary | ICD-10-CM | POA: Diagnosis not present

## 2016-05-27 DIAGNOSIS — H54414A Blindness right eye category 4, normal vision left eye: Secondary | ICD-10-CM | POA: Diagnosis not present

## 2016-05-27 DIAGNOSIS — I69398 Other sequelae of cerebral infarction: Secondary | ICD-10-CM | POA: Diagnosis not present

## 2016-05-27 DIAGNOSIS — T8130XD Disruption of wound, unspecified, subsequent encounter: Secondary | ICD-10-CM | POA: Diagnosis not present

## 2016-05-31 DIAGNOSIS — I10 Essential (primary) hypertension: Secondary | ICD-10-CM | POA: Diagnosis not present

## 2016-05-31 DIAGNOSIS — E119 Type 2 diabetes mellitus without complications: Secondary | ICD-10-CM | POA: Diagnosis not present

## 2016-05-31 DIAGNOSIS — I25119 Atherosclerotic heart disease of native coronary artery with unspecified angina pectoris: Secondary | ICD-10-CM | POA: Diagnosis not present

## 2016-05-31 DIAGNOSIS — I69398 Other sequelae of cerebral infarction: Secondary | ICD-10-CM | POA: Diagnosis not present

## 2016-05-31 DIAGNOSIS — H54414A Blindness right eye category 4, normal vision left eye: Secondary | ICD-10-CM | POA: Diagnosis not present

## 2016-05-31 DIAGNOSIS — T8130XD Disruption of wound, unspecified, subsequent encounter: Secondary | ICD-10-CM | POA: Diagnosis not present

## 2016-06-02 DIAGNOSIS — I69398 Other sequelae of cerebral infarction: Secondary | ICD-10-CM | POA: Diagnosis not present

## 2016-06-02 DIAGNOSIS — I25119 Atherosclerotic heart disease of native coronary artery with unspecified angina pectoris: Secondary | ICD-10-CM | POA: Diagnosis not present

## 2016-06-02 DIAGNOSIS — T8130XD Disruption of wound, unspecified, subsequent encounter: Secondary | ICD-10-CM | POA: Diagnosis not present

## 2016-06-02 DIAGNOSIS — E119 Type 2 diabetes mellitus without complications: Secondary | ICD-10-CM | POA: Diagnosis not present

## 2016-06-02 DIAGNOSIS — I10 Essential (primary) hypertension: Secondary | ICD-10-CM | POA: Diagnosis not present

## 2016-06-02 DIAGNOSIS — H54414A Blindness right eye category 4, normal vision left eye: Secondary | ICD-10-CM | POA: Diagnosis not present

## 2016-06-04 DIAGNOSIS — T8130XD Disruption of wound, unspecified, subsequent encounter: Secondary | ICD-10-CM | POA: Diagnosis not present

## 2016-06-04 DIAGNOSIS — E119 Type 2 diabetes mellitus without complications: Secondary | ICD-10-CM | POA: Diagnosis not present

## 2016-06-04 DIAGNOSIS — H54414A Blindness right eye category 4, normal vision left eye: Secondary | ICD-10-CM | POA: Diagnosis not present

## 2016-06-04 DIAGNOSIS — I69398 Other sequelae of cerebral infarction: Secondary | ICD-10-CM | POA: Diagnosis not present

## 2016-06-04 DIAGNOSIS — I25119 Atherosclerotic heart disease of native coronary artery with unspecified angina pectoris: Secondary | ICD-10-CM | POA: Diagnosis not present

## 2016-06-04 DIAGNOSIS — I10 Essential (primary) hypertension: Secondary | ICD-10-CM | POA: Diagnosis not present

## 2016-06-07 DIAGNOSIS — T8130XD Disruption of wound, unspecified, subsequent encounter: Secondary | ICD-10-CM | POA: Diagnosis not present

## 2016-06-07 DIAGNOSIS — E119 Type 2 diabetes mellitus without complications: Secondary | ICD-10-CM | POA: Diagnosis not present

## 2016-06-07 DIAGNOSIS — I69398 Other sequelae of cerebral infarction: Secondary | ICD-10-CM | POA: Diagnosis not present

## 2016-06-07 DIAGNOSIS — I25119 Atherosclerotic heart disease of native coronary artery with unspecified angina pectoris: Secondary | ICD-10-CM | POA: Diagnosis not present

## 2016-06-07 DIAGNOSIS — H54414A Blindness right eye category 4, normal vision left eye: Secondary | ICD-10-CM | POA: Diagnosis not present

## 2016-06-07 DIAGNOSIS — I10 Essential (primary) hypertension: Secondary | ICD-10-CM | POA: Diagnosis not present

## 2016-06-13 DIAGNOSIS — T8130XD Disruption of wound, unspecified, subsequent encounter: Secondary | ICD-10-CM | POA: Diagnosis not present

## 2016-06-13 DIAGNOSIS — I69398 Other sequelae of cerebral infarction: Secondary | ICD-10-CM | POA: Diagnosis not present

## 2016-06-13 DIAGNOSIS — E119 Type 2 diabetes mellitus without complications: Secondary | ICD-10-CM | POA: Diagnosis not present

## 2016-06-13 DIAGNOSIS — I10 Essential (primary) hypertension: Secondary | ICD-10-CM | POA: Diagnosis not present

## 2016-06-13 DIAGNOSIS — H54414A Blindness right eye category 4, normal vision left eye: Secondary | ICD-10-CM | POA: Diagnosis not present

## 2016-06-13 DIAGNOSIS — I25119 Atherosclerotic heart disease of native coronary artery with unspecified angina pectoris: Secondary | ICD-10-CM | POA: Diagnosis not present

## 2016-06-14 DIAGNOSIS — E119 Type 2 diabetes mellitus without complications: Secondary | ICD-10-CM | POA: Diagnosis not present

## 2016-06-14 DIAGNOSIS — I10 Essential (primary) hypertension: Secondary | ICD-10-CM | POA: Diagnosis not present

## 2016-06-14 DIAGNOSIS — K430 Incisional hernia with obstruction, without gangrene: Secondary | ICD-10-CM | POA: Diagnosis not present

## 2016-06-14 DIAGNOSIS — M1990 Unspecified osteoarthritis, unspecified site: Secondary | ICD-10-CM | POA: Diagnosis not present

## 2016-06-15 DIAGNOSIS — T8130XD Disruption of wound, unspecified, subsequent encounter: Secondary | ICD-10-CM | POA: Diagnosis not present

## 2016-06-15 DIAGNOSIS — H54414A Blindness right eye category 4, normal vision left eye: Secondary | ICD-10-CM | POA: Diagnosis not present

## 2016-06-15 DIAGNOSIS — I69398 Other sequelae of cerebral infarction: Secondary | ICD-10-CM | POA: Diagnosis not present

## 2016-06-15 DIAGNOSIS — E119 Type 2 diabetes mellitus without complications: Secondary | ICD-10-CM | POA: Diagnosis not present

## 2016-06-15 DIAGNOSIS — I10 Essential (primary) hypertension: Secondary | ICD-10-CM | POA: Diagnosis not present

## 2016-06-15 DIAGNOSIS — I25119 Atherosclerotic heart disease of native coronary artery with unspecified angina pectoris: Secondary | ICD-10-CM | POA: Diagnosis not present

## 2016-06-17 DIAGNOSIS — T8130XD Disruption of wound, unspecified, subsequent encounter: Secondary | ICD-10-CM | POA: Diagnosis not present

## 2016-06-17 DIAGNOSIS — I10 Essential (primary) hypertension: Secondary | ICD-10-CM | POA: Diagnosis not present

## 2016-06-17 DIAGNOSIS — I69398 Other sequelae of cerebral infarction: Secondary | ICD-10-CM | POA: Diagnosis not present

## 2016-06-17 DIAGNOSIS — H54414A Blindness right eye category 4, normal vision left eye: Secondary | ICD-10-CM | POA: Diagnosis not present

## 2016-06-17 DIAGNOSIS — E119 Type 2 diabetes mellitus without complications: Secondary | ICD-10-CM | POA: Diagnosis not present

## 2016-06-17 DIAGNOSIS — I25119 Atherosclerotic heart disease of native coronary artery with unspecified angina pectoris: Secondary | ICD-10-CM | POA: Diagnosis not present

## 2016-06-21 DIAGNOSIS — H54414A Blindness right eye category 4, normal vision left eye: Secondary | ICD-10-CM | POA: Diagnosis not present

## 2016-06-21 DIAGNOSIS — E119 Type 2 diabetes mellitus without complications: Secondary | ICD-10-CM | POA: Diagnosis not present

## 2016-06-21 DIAGNOSIS — I69398 Other sequelae of cerebral infarction: Secondary | ICD-10-CM | POA: Diagnosis not present

## 2016-06-21 DIAGNOSIS — T8130XD Disruption of wound, unspecified, subsequent encounter: Secondary | ICD-10-CM | POA: Diagnosis not present

## 2016-06-21 DIAGNOSIS — I10 Essential (primary) hypertension: Secondary | ICD-10-CM | POA: Diagnosis not present

## 2016-06-21 DIAGNOSIS — I25119 Atherosclerotic heart disease of native coronary artery with unspecified angina pectoris: Secondary | ICD-10-CM | POA: Diagnosis not present

## 2016-06-24 DIAGNOSIS — I25119 Atherosclerotic heart disease of native coronary artery with unspecified angina pectoris: Secondary | ICD-10-CM | POA: Diagnosis not present

## 2016-06-24 DIAGNOSIS — E119 Type 2 diabetes mellitus without complications: Secondary | ICD-10-CM | POA: Diagnosis not present

## 2016-06-24 DIAGNOSIS — T8130XD Disruption of wound, unspecified, subsequent encounter: Secondary | ICD-10-CM | POA: Diagnosis not present

## 2016-06-24 DIAGNOSIS — I69398 Other sequelae of cerebral infarction: Secondary | ICD-10-CM | POA: Diagnosis not present

## 2016-06-24 DIAGNOSIS — I10 Essential (primary) hypertension: Secondary | ICD-10-CM | POA: Diagnosis not present

## 2016-06-24 DIAGNOSIS — H54414A Blindness right eye category 4, normal vision left eye: Secondary | ICD-10-CM | POA: Diagnosis not present

## 2016-06-27 DIAGNOSIS — I69398 Other sequelae of cerebral infarction: Secondary | ICD-10-CM | POA: Diagnosis not present

## 2016-06-27 DIAGNOSIS — I25119 Atherosclerotic heart disease of native coronary artery with unspecified angina pectoris: Secondary | ICD-10-CM | POA: Diagnosis not present

## 2016-06-27 DIAGNOSIS — E119 Type 2 diabetes mellitus without complications: Secondary | ICD-10-CM | POA: Diagnosis not present

## 2016-06-27 DIAGNOSIS — T8130XD Disruption of wound, unspecified, subsequent encounter: Secondary | ICD-10-CM | POA: Diagnosis not present

## 2016-06-27 DIAGNOSIS — H54414A Blindness right eye category 4, normal vision left eye: Secondary | ICD-10-CM | POA: Diagnosis not present

## 2016-06-27 DIAGNOSIS — I10 Essential (primary) hypertension: Secondary | ICD-10-CM | POA: Diagnosis not present

## 2016-06-30 DIAGNOSIS — E119 Type 2 diabetes mellitus without complications: Secondary | ICD-10-CM | POA: Diagnosis not present

## 2016-06-30 DIAGNOSIS — T8130XD Disruption of wound, unspecified, subsequent encounter: Secondary | ICD-10-CM | POA: Diagnosis not present

## 2016-06-30 DIAGNOSIS — I69398 Other sequelae of cerebral infarction: Secondary | ICD-10-CM | POA: Diagnosis not present

## 2016-06-30 DIAGNOSIS — I10 Essential (primary) hypertension: Secondary | ICD-10-CM | POA: Diagnosis not present

## 2016-06-30 DIAGNOSIS — H54414A Blindness right eye category 4, normal vision left eye: Secondary | ICD-10-CM | POA: Diagnosis not present

## 2016-06-30 DIAGNOSIS — I25119 Atherosclerotic heart disease of native coronary artery with unspecified angina pectoris: Secondary | ICD-10-CM | POA: Diagnosis not present

## 2016-07-12 DIAGNOSIS — I10 Essential (primary) hypertension: Secondary | ICD-10-CM | POA: Diagnosis not present

## 2016-07-12 DIAGNOSIS — E7800 Pure hypercholesterolemia, unspecified: Secondary | ICD-10-CM | POA: Diagnosis not present

## 2016-07-12 DIAGNOSIS — E663 Overweight: Secondary | ICD-10-CM | POA: Diagnosis not present

## 2016-07-12 DIAGNOSIS — M109 Gout, unspecified: Secondary | ICD-10-CM | POA: Diagnosis not present

## 2016-07-12 DIAGNOSIS — F329 Major depressive disorder, single episode, unspecified: Secondary | ICD-10-CM | POA: Diagnosis not present

## 2016-07-12 DIAGNOSIS — I2510 Atherosclerotic heart disease of native coronary artery without angina pectoris: Secondary | ICD-10-CM | POA: Diagnosis not present

## 2016-07-12 DIAGNOSIS — K219 Gastro-esophageal reflux disease without esophagitis: Secondary | ICD-10-CM | POA: Diagnosis not present

## 2016-07-12 DIAGNOSIS — E1159 Type 2 diabetes mellitus with other circulatory complications: Secondary | ICD-10-CM | POA: Diagnosis not present

## 2016-07-12 DIAGNOSIS — Z6829 Body mass index (BMI) 29.0-29.9, adult: Secondary | ICD-10-CM | POA: Diagnosis not present

## 2016-07-12 DIAGNOSIS — F419 Anxiety disorder, unspecified: Secondary | ICD-10-CM | POA: Diagnosis not present

## 2016-07-12 DIAGNOSIS — I739 Peripheral vascular disease, unspecified: Secondary | ICD-10-CM | POA: Diagnosis not present

## 2016-08-17 DIAGNOSIS — N959 Unspecified menopausal and perimenopausal disorder: Secondary | ICD-10-CM | POA: Diagnosis not present

## 2016-08-17 DIAGNOSIS — Z0000 Encounter for general adult medical examination without abnormal findings: Secondary | ICD-10-CM | POA: Diagnosis not present

## 2016-08-17 DIAGNOSIS — E7800 Pure hypercholesterolemia, unspecified: Secondary | ICD-10-CM | POA: Diagnosis not present

## 2016-08-17 DIAGNOSIS — Z1231 Encounter for screening mammogram for malignant neoplasm of breast: Secondary | ICD-10-CM | POA: Diagnosis not present

## 2016-08-17 DIAGNOSIS — Z136 Encounter for screening for cardiovascular disorders: Secondary | ICD-10-CM | POA: Diagnosis not present

## 2016-08-17 DIAGNOSIS — Z1389 Encounter for screening for other disorder: Secondary | ICD-10-CM | POA: Diagnosis not present

## 2016-08-17 DIAGNOSIS — Z9181 History of falling: Secondary | ICD-10-CM | POA: Diagnosis not present

## 2016-09-27 DIAGNOSIS — Z95820 Peripheral vascular angioplasty status with implants and grafts: Secondary | ICD-10-CM | POA: Diagnosis not present

## 2016-09-27 DIAGNOSIS — I6523 Occlusion and stenosis of bilateral carotid arteries: Secondary | ICD-10-CM | POA: Diagnosis not present

## 2016-09-27 DIAGNOSIS — I70203 Unspecified atherosclerosis of native arteries of extremities, bilateral legs: Secondary | ICD-10-CM | POA: Diagnosis not present

## 2016-10-11 DIAGNOSIS — I2510 Atherosclerotic heart disease of native coronary artery without angina pectoris: Secondary | ICD-10-CM | POA: Diagnosis not present

## 2016-10-11 DIAGNOSIS — K219 Gastro-esophageal reflux disease without esophagitis: Secondary | ICD-10-CM | POA: Diagnosis not present

## 2016-10-11 DIAGNOSIS — F329 Major depressive disorder, single episode, unspecified: Secondary | ICD-10-CM | POA: Diagnosis not present

## 2016-10-11 DIAGNOSIS — E663 Overweight: Secondary | ICD-10-CM | POA: Diagnosis not present

## 2016-10-11 DIAGNOSIS — Z6829 Body mass index (BMI) 29.0-29.9, adult: Secondary | ICD-10-CM | POA: Diagnosis not present

## 2016-10-11 DIAGNOSIS — E7800 Pure hypercholesterolemia, unspecified: Secondary | ICD-10-CM | POA: Diagnosis not present

## 2016-10-11 DIAGNOSIS — F419 Anxiety disorder, unspecified: Secondary | ICD-10-CM | POA: Diagnosis not present

## 2016-10-11 DIAGNOSIS — E1159 Type 2 diabetes mellitus with other circulatory complications: Secondary | ICD-10-CM | POA: Diagnosis not present

## 2016-10-11 DIAGNOSIS — I739 Peripheral vascular disease, unspecified: Secondary | ICD-10-CM | POA: Diagnosis not present

## 2016-10-11 DIAGNOSIS — M109 Gout, unspecified: Secondary | ICD-10-CM | POA: Diagnosis not present

## 2016-10-11 DIAGNOSIS — I10 Essential (primary) hypertension: Secondary | ICD-10-CM | POA: Diagnosis not present

## 2016-11-10 DIAGNOSIS — I739 Peripheral vascular disease, unspecified: Secondary | ICD-10-CM | POA: Diagnosis not present

## 2016-11-10 DIAGNOSIS — I70203 Unspecified atherosclerosis of native arteries of extremities, bilateral legs: Secondary | ICD-10-CM | POA: Diagnosis not present

## 2016-11-10 DIAGNOSIS — I6523 Occlusion and stenosis of bilateral carotid arteries: Secondary | ICD-10-CM | POA: Diagnosis not present

## 2016-11-18 DIAGNOSIS — N959 Unspecified menopausal and perimenopausal disorder: Secondary | ICD-10-CM | POA: Diagnosis not present

## 2016-11-18 DIAGNOSIS — Z1231 Encounter for screening mammogram for malignant neoplasm of breast: Secondary | ICD-10-CM | POA: Diagnosis not present

## 2016-11-18 DIAGNOSIS — M85851 Other specified disorders of bone density and structure, right thigh: Secondary | ICD-10-CM | POA: Diagnosis not present

## 2017-01-11 DIAGNOSIS — K219 Gastro-esophageal reflux disease without esophagitis: Secondary | ICD-10-CM | POA: Diagnosis not present

## 2017-01-11 DIAGNOSIS — E1159 Type 2 diabetes mellitus with other circulatory complications: Secondary | ICD-10-CM | POA: Diagnosis not present

## 2017-01-11 DIAGNOSIS — Z6828 Body mass index (BMI) 28.0-28.9, adult: Secondary | ICD-10-CM | POA: Diagnosis not present

## 2017-01-11 DIAGNOSIS — E7800 Pure hypercholesterolemia, unspecified: Secondary | ICD-10-CM | POA: Diagnosis not present

## 2017-01-11 DIAGNOSIS — M8580 Other specified disorders of bone density and structure, unspecified site: Secondary | ICD-10-CM | POA: Diagnosis not present

## 2017-01-11 DIAGNOSIS — F329 Major depressive disorder, single episode, unspecified: Secondary | ICD-10-CM | POA: Diagnosis not present

## 2017-01-11 DIAGNOSIS — I10 Essential (primary) hypertension: Secondary | ICD-10-CM | POA: Diagnosis not present

## 2017-01-11 DIAGNOSIS — F419 Anxiety disorder, unspecified: Secondary | ICD-10-CM | POA: Diagnosis not present

## 2017-01-11 DIAGNOSIS — I739 Peripheral vascular disease, unspecified: Secondary | ICD-10-CM | POA: Diagnosis not present

## 2017-01-11 DIAGNOSIS — I2510 Atherosclerotic heart disease of native coronary artery without angina pectoris: Secondary | ICD-10-CM | POA: Diagnosis not present

## 2017-01-11 DIAGNOSIS — M109 Gout, unspecified: Secondary | ICD-10-CM | POA: Diagnosis not present

## 2017-02-12 HISTORY — PX: COLONOSCOPY: SHX174

## 2017-02-19 DIAGNOSIS — E860 Dehydration: Secondary | ICD-10-CM | POA: Diagnosis not present

## 2017-02-19 DIAGNOSIS — K529 Noninfective gastroenteritis and colitis, unspecified: Secondary | ICD-10-CM | POA: Diagnosis not present

## 2017-02-19 DIAGNOSIS — D72829 Elevated white blood cell count, unspecified: Secondary | ICD-10-CM | POA: Diagnosis not present

## 2017-02-19 DIAGNOSIS — I959 Hypotension, unspecified: Secondary | ICD-10-CM | POA: Diagnosis not present

## 2017-02-19 DIAGNOSIS — R079 Chest pain, unspecified: Secondary | ICD-10-CM | POA: Diagnosis not present

## 2017-02-19 DIAGNOSIS — K439 Ventral hernia without obstruction or gangrene: Secondary | ICD-10-CM | POA: Diagnosis not present

## 2017-02-19 DIAGNOSIS — R7989 Other specified abnormal findings of blood chemistry: Secondary | ICD-10-CM | POA: Diagnosis not present

## 2017-02-19 DIAGNOSIS — N179 Acute kidney failure, unspecified: Secondary | ICD-10-CM | POA: Diagnosis not present

## 2017-02-20 DIAGNOSIS — E119 Type 2 diabetes mellitus without complications: Secondary | ICD-10-CM | POA: Diagnosis present

## 2017-02-20 DIAGNOSIS — Z955 Presence of coronary angioplasty implant and graft: Secondary | ICD-10-CM | POA: Diagnosis not present

## 2017-02-20 DIAGNOSIS — Z79899 Other long term (current) drug therapy: Secondary | ICD-10-CM | POA: Diagnosis not present

## 2017-02-20 DIAGNOSIS — H548 Legal blindness, as defined in USA: Secondary | ICD-10-CM | POA: Diagnosis present

## 2017-02-20 DIAGNOSIS — E7800 Pure hypercholesterolemia, unspecified: Secondary | ICD-10-CM | POA: Diagnosis present

## 2017-02-20 DIAGNOSIS — F418 Other specified anxiety disorders: Secondary | ICD-10-CM | POA: Diagnosis present

## 2017-02-20 DIAGNOSIS — K5190 Ulcerative colitis, unspecified, without complications: Secondary | ICD-10-CM | POA: Diagnosis not present

## 2017-02-20 DIAGNOSIS — K529 Noninfective gastroenteritis and colitis, unspecified: Secondary | ICD-10-CM | POA: Diagnosis not present

## 2017-02-20 DIAGNOSIS — D500 Iron deficiency anemia secondary to blood loss (chronic): Secondary | ICD-10-CM | POA: Diagnosis present

## 2017-02-20 DIAGNOSIS — I69912 Visuospatial deficit and spatial neglect following unspecified cerebrovascular disease: Secondary | ICD-10-CM | POA: Diagnosis present

## 2017-02-20 DIAGNOSIS — K219 Gastro-esophageal reflux disease without esophagitis: Secondary | ICD-10-CM | POA: Diagnosis present

## 2017-02-20 DIAGNOSIS — I2510 Atherosclerotic heart disease of native coronary artery without angina pectoris: Secondary | ICD-10-CM | POA: Diagnosis present

## 2017-02-20 DIAGNOSIS — I252 Old myocardial infarction: Secondary | ICD-10-CM | POA: Diagnosis not present

## 2017-02-20 DIAGNOSIS — Z87891 Personal history of nicotine dependence: Secondary | ICD-10-CM | POA: Diagnosis not present

## 2017-02-20 DIAGNOSIS — R197 Diarrhea, unspecified: Secondary | ICD-10-CM | POA: Diagnosis not present

## 2017-02-20 DIAGNOSIS — I739 Peripheral vascular disease, unspecified: Secondary | ICD-10-CM | POA: Diagnosis present

## 2017-02-20 DIAGNOSIS — Z7982 Long term (current) use of aspirin: Secondary | ICD-10-CM | POA: Diagnosis not present

## 2017-02-20 DIAGNOSIS — K55032 Diffuse acute (reversible) ischemia of large intestine: Secondary | ICD-10-CM | POA: Diagnosis not present

## 2017-02-20 DIAGNOSIS — K439 Ventral hernia without obstruction or gangrene: Secondary | ICD-10-CM | POA: Diagnosis not present

## 2017-02-20 DIAGNOSIS — D62 Acute posthemorrhagic anemia: Secondary | ICD-10-CM | POA: Diagnosis not present

## 2017-02-20 DIAGNOSIS — N179 Acute kidney failure, unspecified: Secondary | ICD-10-CM | POA: Diagnosis not present

## 2017-02-20 DIAGNOSIS — F329 Major depressive disorder, single episode, unspecified: Secondary | ICD-10-CM | POA: Diagnosis present

## 2017-02-20 DIAGNOSIS — I959 Hypotension, unspecified: Secondary | ICD-10-CM | POA: Diagnosis not present

## 2017-02-20 DIAGNOSIS — D72829 Elevated white blood cell count, unspecified: Secondary | ICD-10-CM | POA: Diagnosis not present

## 2017-02-20 DIAGNOSIS — E860 Dehydration: Secondary | ICD-10-CM | POA: Diagnosis not present

## 2017-02-20 DIAGNOSIS — K5730 Diverticulosis of large intestine without perforation or abscess without bleeding: Secondary | ICD-10-CM | POA: Diagnosis not present

## 2017-02-20 DIAGNOSIS — K559 Vascular disorder of intestine, unspecified: Secondary | ICD-10-CM | POA: Diagnosis present

## 2017-02-20 DIAGNOSIS — A09 Infectious gastroenteritis and colitis, unspecified: Secondary | ICD-10-CM | POA: Diagnosis present

## 2017-02-20 DIAGNOSIS — R079 Chest pain, unspecified: Secondary | ICD-10-CM | POA: Diagnosis not present

## 2017-02-20 DIAGNOSIS — M1990 Unspecified osteoarthritis, unspecified site: Secondary | ICD-10-CM | POA: Diagnosis present

## 2017-02-20 DIAGNOSIS — R7989 Other specified abnormal findings of blood chemistry: Secondary | ICD-10-CM | POA: Diagnosis not present

## 2017-02-20 DIAGNOSIS — I10 Essential (primary) hypertension: Secondary | ICD-10-CM | POA: Diagnosis present

## 2017-02-20 DIAGNOSIS — K5731 Diverticulosis of large intestine without perforation or abscess with bleeding: Secondary | ICD-10-CM | POA: Diagnosis present

## 2017-02-20 DIAGNOSIS — K922 Gastrointestinal hemorrhage, unspecified: Secondary | ICD-10-CM | POA: Diagnosis not present

## 2017-02-20 DIAGNOSIS — H349 Unspecified retinal vascular occlusion: Secondary | ICD-10-CM | POA: Diagnosis not present

## 2017-02-22 DIAGNOSIS — K5730 Diverticulosis of large intestine without perforation or abscess without bleeding: Secondary | ICD-10-CM | POA: Diagnosis not present

## 2017-02-22 DIAGNOSIS — K559 Vascular disorder of intestine, unspecified: Secondary | ICD-10-CM | POA: Diagnosis not present

## 2017-02-22 DIAGNOSIS — E119 Type 2 diabetes mellitus without complications: Secondary | ICD-10-CM | POA: Diagnosis not present

## 2017-02-28 ENCOUNTER — Other Ambulatory Visit: Payer: Self-pay

## 2017-02-28 DIAGNOSIS — F419 Anxiety disorder, unspecified: Secondary | ICD-10-CM

## 2017-02-28 DIAGNOSIS — F32A Depression, unspecified: Secondary | ICD-10-CM

## 2017-02-28 DIAGNOSIS — N390 Urinary tract infection, site not specified: Secondary | ICD-10-CM

## 2017-02-28 DIAGNOSIS — I447 Left bundle-branch block, unspecified: Secondary | ICD-10-CM

## 2017-02-28 DIAGNOSIS — K430 Incisional hernia with obstruction, without gangrene: Secondary | ICD-10-CM

## 2017-02-28 DIAGNOSIS — F329 Major depressive disorder, single episode, unspecified: Secondary | ICD-10-CM | POA: Insufficient documentation

## 2017-02-28 DIAGNOSIS — C801 Malignant (primary) neoplasm, unspecified: Secondary | ICD-10-CM | POA: Insufficient documentation

## 2017-02-28 DIAGNOSIS — I2510 Atherosclerotic heart disease of native coronary artery without angina pectoris: Secondary | ICD-10-CM

## 2017-02-28 DIAGNOSIS — M479 Spondylosis, unspecified: Secondary | ICD-10-CM

## 2017-02-28 DIAGNOSIS — E663 Overweight: Secondary | ICD-10-CM | POA: Diagnosis not present

## 2017-02-28 DIAGNOSIS — K219 Gastro-esophageal reflux disease without esophagitis: Secondary | ICD-10-CM

## 2017-02-28 DIAGNOSIS — R109 Unspecified abdominal pain: Secondary | ICD-10-CM

## 2017-02-28 DIAGNOSIS — M109 Gout, unspecified: Secondary | ICD-10-CM | POA: Diagnosis not present

## 2017-02-28 DIAGNOSIS — I10 Essential (primary) hypertension: Secondary | ICD-10-CM

## 2017-02-28 DIAGNOSIS — I219 Acute myocardial infarction, unspecified: Secondary | ICD-10-CM | POA: Insufficient documentation

## 2017-02-28 DIAGNOSIS — I959 Hypotension, unspecified: Secondary | ICD-10-CM

## 2017-02-28 DIAGNOSIS — M8580 Other specified disorders of bone density and structure, unspecified site: Secondary | ICD-10-CM | POA: Diagnosis not present

## 2017-02-28 DIAGNOSIS — M1990 Unspecified osteoarthritis, unspecified site: Secondary | ICD-10-CM | POA: Insufficient documentation

## 2017-02-28 DIAGNOSIS — E860 Dehydration: Secondary | ICD-10-CM

## 2017-02-28 DIAGNOSIS — K529 Noninfective gastroenteritis and colitis, unspecified: Secondary | ICD-10-CM

## 2017-02-28 DIAGNOSIS — L0232 Furuncle of buttock: Secondary | ICD-10-CM

## 2017-02-28 DIAGNOSIS — K559 Vascular disorder of intestine, unspecified: Secondary | ICD-10-CM | POA: Diagnosis not present

## 2017-02-28 DIAGNOSIS — I739 Peripheral vascular disease, unspecified: Secondary | ICD-10-CM | POA: Diagnosis not present

## 2017-02-28 DIAGNOSIS — H548 Legal blindness, as defined in USA: Secondary | ICD-10-CM

## 2017-02-28 DIAGNOSIS — E1159 Type 2 diabetes mellitus with other circulatory complications: Secondary | ICD-10-CM | POA: Diagnosis not present

## 2017-02-28 DIAGNOSIS — E119 Type 2 diabetes mellitus without complications: Secondary | ICD-10-CM

## 2017-02-28 DIAGNOSIS — E7800 Pure hypercholesterolemia, unspecified: Secondary | ICD-10-CM

## 2017-02-28 HISTORY — DX: Dehydration: E86.0

## 2017-02-28 HISTORY — DX: Type 2 diabetes mellitus without complications: E11.9

## 2017-02-28 HISTORY — DX: Atherosclerotic heart disease of native coronary artery without angina pectoris: I25.10

## 2017-02-28 HISTORY — DX: Hypotension, unspecified: I95.9

## 2017-02-28 HISTORY — DX: Furuncle of buttock: L02.32

## 2017-02-28 HISTORY — DX: Unspecified abdominal pain: R10.9

## 2017-02-28 HISTORY — DX: Essential (primary) hypertension: I10

## 2017-02-28 HISTORY — DX: Legal blindness, as defined in USA: H54.8

## 2017-02-28 HISTORY — DX: Pure hypercholesterolemia, unspecified: E78.00

## 2017-02-28 HISTORY — DX: Left bundle-branch block, unspecified: I44.7

## 2017-02-28 HISTORY — DX: Urinary tract infection, site not specified: N39.0

## 2017-02-28 HISTORY — DX: Gastro-esophageal reflux disease without esophagitis: K21.9

## 2017-02-28 HISTORY — DX: Depression, unspecified: F32.A

## 2017-02-28 HISTORY — DX: Spondylosis, unspecified: M47.9

## 2017-02-28 HISTORY — DX: Noninfective gastroenteritis and colitis, unspecified: K52.9

## 2017-02-28 HISTORY — DX: Anxiety disorder, unspecified: F41.9

## 2017-02-28 HISTORY — DX: Incisional hernia with obstruction, without gangrene: K43.0

## 2017-02-28 HISTORY — DX: Unspecified osteoarthritis, unspecified site: M19.90

## 2017-03-02 DIAGNOSIS — D649 Anemia, unspecified: Secondary | ICD-10-CM | POA: Insufficient documentation

## 2017-03-02 HISTORY — DX: Anemia, unspecified: D64.9

## 2017-03-02 NOTE — Progress Notes (Signed)
Cardiology Office Note:    Date:  03/03/2017   ID:  Marisa Callahan, DOB 11-16-1950, MRN [id number]  PCP:  Cher Nakai, MD  Cardiologist:  Shirlee More, MD   Referring MD: No ref. provider found  ASSESSMENT:    1. Coronary artery disease involving native coronary artery of native heart with angina pectoris ([place])   2. Iron deficiency anemia due to chronic blood loss   3. Sinus tachycardia   4. Essential hypertension   5. Left bundle branch block   6. Hypercholesteremia    PLAN:    In order of problems listed above:  1. He is off clopidogrel and should remain off until safe to restart at the discretion of GI. 2. Check CBC today with tachycardia pallor relatively low blood pressure 3. Secondary to anemia fever, check CBC today. 4. Worsened I asked her to stop her ACE inhibitor and not to resume unless systolics are greater than 140 with ischemic colitis 5. Stable EKG pattern 6. Stable continue her statin  Next appointment  3 months   Medication Adjustments/Labs and Tests Ordered: Current medicines are reviewed at length with the patient today.  Concerns regarding medicines are outlined above.  Orders Placed This Encounter  Procedures  . CBC  . EKG 12-Lead   Meds ordered this encounter  Medications  . nitroGLYCERIN (NITROSTAT) 0.4 MG SL tablet    Sig: Place 1 tablet (0.4 mg total) under the tongue every 5 (five) minutes as needed for chest pain.    Dispense:  25 tablet    Refill:  11     Chief Complaint  Patient presents with  . Hospitalization Follow-up    has not been taking plavix for about 3 weeks   . Coronary Artery Disease    History of Present Illness:    Marisa Callahan is a 66 y.o. female with CAD and remote PCI in 1999 who is being seen today for the evaluation after [facility] admission with diarrhea, hypotension, ischemic colitis, anemia requiring transfusion and discontinuation of clopidogrel with heme + stool at the request of Dr Darius Bump She does not feel well.  She feels fatigued continues to have crampy abdominal pain and diarrhea this morning she had shaking chillsand has a fever of 100.7 in my office. I asked her to notify the GI doctor. She is not having chest pain palpitation shortness of breath or syncope. Her resting heart rate is 110 bpm EKG shows sinus tachycardia. Her home blood pressure is been running less than 825 systolic.   Past Medical History:  Diagnosis Date  . Abdominal pain 02/28/2017  . Anxiety 02/28/2017  . Arthritis 02/28/2017  . Atherosclerosis of native artery of both lower extremities ([place]) 09/28/2015  . Bilateral carotid artery stenosis 09/28/2015  . Boil of buttock 02/28/2017   Overview:  right  . Cancer ([place]) 02/28/2017  . Colitis 02/28/2017  . Coronary artery disease 02/28/2017   Overview:  history of coronary stents  . Dehydration 02/28/2017  . Depression 02/28/2017  . Diabetes mellitus ([place]) 02/28/2017  . GERD (gastroesophageal reflux disease) 02/28/2017  . Hypercholesteremia 02/28/2017  . Hypertension 02/28/2017  . Hypotension 02/28/2017   [facility] 02/19/17  . Incisional hernia, incarcerated 02/28/2017  . Left bundle branch block 02/28/2017  . 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 MI (myocardial infarction) ([place]) 02/28/2017  . Osteoarthritis of spine 02/28/2017  . PVD (peripheral vascular disease) ([place]) 09/28/2015  . UTI (urinary tract infection) 02/28/2017  . Ventral  hernia 09/28/2015    Past Surgical History:  Procedure Laterality Date  . ABDOMINAL AORTIC ANEURYSM REPAIR     open  . ABDOMINAL HERNIA REPAIR     with dehiscence and chronic wound  . ABDOMINAL HYSTERECTOMY    . CARDIAC CATHETERIZATION      Current Medications: Current Meds  Medication Sig  . aspirin EC 81 MG tablet Take 81 mg by mouth at bedtime.   Marland Kitchen atorvastatin (LIPITOR) 40 MG tablet Take 40 mg by mouth at bedtime.  . citalopram (CELEXA) 20 MG tablet Take 20 mg by mouth daily.  Marland Kitchen ezetimibe (ZETIA) 10 MG tablet Take 10 mg  by mouth daily.  . Ferrous Sulfate (IRON) 325 (65 Fe) MG TABS Take 1 tablet by mouth daily.  . folic acid (FOLVITE) 1 MG tablet Take 1 mg by mouth daily.  . metFORMIN (GLUCOPHAGE) 850 MG tablet Take 850 mg by mouth at bedtime.  . pantoprazole (PROTONIX) 40 MG tablet Take 40 mg by mouth daily.  . Probiotic Product (PROBIOTIC DAILY PO) Take 1 capsule by mouth daily.  . [DISCONTINUED] lisinopril (PRINIVIL,ZESTRIL) 20 MG tablet Take 10 mg by mouth daily.      Allergies:   Tape   Social History   Social History  . Marital status: Married    Spouse name: N/A  . Number of children: N/A  . Years of education: N/A   Social History Main Topics  . Smoking status: Former Smoker    Quit date: 1999  . Smokeless tobacco: Never Used  . Alcohol use No  . Drug use: No  . Sexual activity: Not Asked   Other Topics Concern  . None   Social History Narrative  . None     Family History: The patient's family history includes Congestive Heart Failure in her mother and sister; Diabetes in her brother, mother, and sister; Hypertension in her mother and sister; Stroke in her mother.  ROS:   ROS Please see the history of present illness.     All other systems reviewed and are negative.  EKGs/Labs/Other Studies Reviewed:    The following studies were reviewed today:   EKG:  EKG is  ordered today.  The ekg ordered today demonstrates sinus tachycardia and incomplete left bundle branch block  Recent Labs: Hgb 6.4 - 9.8 , CMP normal, troponin's < 0.01 No results found for requested labs within last 8760 hours.  Recent Lipid Panel No results found for: CHOL, TRIG, HDL, CHOLHDL, VLDL, LDLCALC, LDLDIRECT  Physical Exam:    VS:  BP (!) 106/54 (BP Location: Right Arm, Patient Position: Sitting)   Pulse (!) 115   Temp (!) 100.7 F (38.2 C) (Oral)   Ht 5' (1.524 m)   Wt 136 lb 12.8 oz (62.1 kg)   SpO2 97%   BMI 26.72 kg/m     Wt Readings from Last 3 Encounters:  03/03/17 136 lb 12.8 oz (62.1  kg)     GEN: her skin is warm and flushed there is pallor Well nourished, well developed in no acute distress HEENT: Normal NECK: No JVD; No carotid bruits LYMPHATICS: No lymphadenopathy CARDIAC: resting tachycardia paradoxical second heart soundRRR, no murmurs, rubs, gallops RESPIRATORY:  Clear to auscultation without rales, wheezing or rhonchi  ABDOMEN: Soft, non-tender, non-distended MUSCULOSKELETAL:  No edema; No deformity  SKIN: Warm and dry NEUROLOGIC:  Alert and oriented x 3 PSYCHIATRIC:  Normal affect     Signed, Shirlee More, MD  03/03/2017 12:28 PM    [facility]

## 2017-03-03 ENCOUNTER — Encounter: Payer: Self-pay | Admitting: Cardiology

## 2017-03-03 ENCOUNTER — Ambulatory Visit (INDEPENDENT_AMBULATORY_CARE_PROVIDER_SITE_OTHER): Payer: Medicare Other | Admitting: Cardiology

## 2017-03-03 VITALS — BP 106/54 | HR 115 | Temp 100.7°F | Ht 60.0 in | Wt 136.8 lb

## 2017-03-03 DIAGNOSIS — R000 Tachycardia, unspecified: Secondary | ICD-10-CM

## 2017-03-03 DIAGNOSIS — I25119 Atherosclerotic heart disease of native coronary artery with unspecified angina pectoris: Secondary | ICD-10-CM | POA: Diagnosis not present

## 2017-03-03 DIAGNOSIS — E7800 Pure hypercholesterolemia, unspecified: Secondary | ICD-10-CM

## 2017-03-03 DIAGNOSIS — D500 Iron deficiency anemia secondary to blood loss (chronic): Secondary | ICD-10-CM

## 2017-03-03 DIAGNOSIS — I10 Essential (primary) hypertension: Secondary | ICD-10-CM | POA: Diagnosis not present

## 2017-03-03 DIAGNOSIS — I447 Left bundle-branch block, unspecified: Secondary | ICD-10-CM

## 2017-03-03 MED ORDER — NITROGLYCERIN 0.4 MG SL SUBL: 0.4000 mg | SUBLINGUAL_TABLET | SUBLINGUAL | 11 refills | Status: DC | PRN

## 2017-03-03 NOTE — Patient Instructions (Addendum)
Medication Instructions:  Your physician has recommended you make the following change in your medication:  STOP lisinopril. If your BP is > 140 mm hg resume your lisinopril  Labwork: Your physician recommends that you return for lab work in: today. CBC  Testing/Procedures: You had an EKG today.  Follow-Up: Your physician recommends that you schedule a follow-up appointment in: 3 months.  Any Other Special Instructions Will Be Listed Below (If Applicable).     If you need a refill on your cardiac medications before your next appointment, please call your pharmacy.   Call Dr Lyndel Safe today, re chills and fever 100.7. Tell them you had a CBC checked in our office.

## 2017-03-04 LAB — CBC
HEMATOCRIT: 36.1 % (ref 34.0–46.6)
HEMOGLOBIN: 10.8 g/dL — AB (ref 11.1–15.9)
MCH: 22 pg — ABNORMAL LOW (ref 26.6–33.0)
MCHC: 29.9 g/dL — AB (ref 31.5–35.7)
MCV: 73 fL — ABNORMAL LOW (ref 79–97)
Platelets: 457 10*3/uL — ABNORMAL HIGH (ref 150–379)
RBC: 4.92 x10E6/uL (ref 3.77–5.28)
RDW: 27.1 % — AB (ref 12.3–15.4)
WBC: 9.3 10*3/uL (ref 3.4–10.8)

## 2017-03-14 DIAGNOSIS — K559 Vascular disorder of intestine, unspecified: Secondary | ICD-10-CM | POA: Diagnosis not present

## 2017-03-21 DIAGNOSIS — Z23 Encounter for immunization: Secondary | ICD-10-CM | POA: Diagnosis not present

## 2017-04-13 DIAGNOSIS — Z6826 Body mass index (BMI) 26.0-26.9, adult: Secondary | ICD-10-CM | POA: Diagnosis not present

## 2017-04-13 DIAGNOSIS — I2510 Atherosclerotic heart disease of native coronary artery without angina pectoris: Secondary | ICD-10-CM | POA: Diagnosis not present

## 2017-04-13 DIAGNOSIS — E7800 Pure hypercholesterolemia, unspecified: Secondary | ICD-10-CM | POA: Diagnosis not present

## 2017-04-13 DIAGNOSIS — I739 Peripheral vascular disease, unspecified: Secondary | ICD-10-CM | POA: Diagnosis not present

## 2017-04-13 DIAGNOSIS — M109 Gout, unspecified: Secondary | ICD-10-CM | POA: Diagnosis not present

## 2017-04-13 DIAGNOSIS — K219 Gastro-esophageal reflux disease without esophagitis: Secondary | ICD-10-CM | POA: Diagnosis not present

## 2017-04-13 DIAGNOSIS — E669 Obesity, unspecified: Secondary | ICD-10-CM | POA: Diagnosis not present

## 2017-04-13 DIAGNOSIS — M8580 Other specified disorders of bone density and structure, unspecified site: Secondary | ICD-10-CM | POA: Diagnosis not present

## 2017-04-13 DIAGNOSIS — E1159 Type 2 diabetes mellitus with other circulatory complications: Secondary | ICD-10-CM | POA: Diagnosis not present

## 2017-04-13 DIAGNOSIS — F329 Major depressive disorder, single episode, unspecified: Secondary | ICD-10-CM | POA: Diagnosis not present

## 2017-04-13 DIAGNOSIS — I10 Essential (primary) hypertension: Secondary | ICD-10-CM | POA: Diagnosis not present

## 2017-04-13 DIAGNOSIS — F419 Anxiety disorder, unspecified: Secondary | ICD-10-CM | POA: Diagnosis not present

## 2017-04-24 DIAGNOSIS — E119 Type 2 diabetes mellitus without complications: Secondary | ICD-10-CM | POA: Diagnosis not present

## 2017-06-01 NOTE — Progress Notes (Signed)
Cardiology Office Note:    Date:  06/01/2017   ID:  Marisa Callahan, DOB [birth year], MRN [id number]  PCP:  Cher Nakai, MD  Cardiologist:  Shirlee More, MD    Referring MD: Cher Nakai, MD    ASSESSMENT:    1. Coronary artery disease involving native coronary artery of native heart with angina pectoris ([place])   2. Essential hypertension   3. Hypercholesteremia    PLAN:    In order of problems listed above:  1. Aspirin statin and plan myocardial perfusion study with remote PCI.  If she has high risk markers would benefit from revascularization.  At this time with the recent GI bleed I would not reinstitute clopidogrel 2. Stable she cannot tolerate ACE inhibitor she had symptomatic hypotension as an outpatient. 3. Continue her statin   Next appointment: 6 months   Medication Adjustments/Labs and Tests Ordered: Current medicines are reviewed at length with the patient today.  Concerns regarding medicines are outlined above.  No orders of the defined types were placed in this encounter.  No orders of the defined types were placed in this encounter.   No chief complaint on file.   History of Present Illness:    Marisa Callahan is a 66 y.o. female with a hx of CAD and remote PCI in 1999 who is being seen today for the evaluation after RH admission with diarrhea, hypotension, ischemic colitis, anemia requiring transfusion and discontinuation of clopidogrel with heme + stool last seen 3 months ago.She is off her ACEI with systolic < 027. Compliance with diet, lifestyle and medications: Yes She underwent colonoscopy she told me she had ulceration of her colon and has recovered except for some intermittent lower abdominal crampy pain.  She has had no diarrhea or bleeding.  She is limiting her activities but is not having angina palpitations or syncope.  She has a history of peripheral vascular disease and abdominal aortic aneurysm surgery. Past Medical History:  Diagnosis Date  . Abdominal  pain 02/28/2017  . Anxiety 02/28/2017  . Arthritis 02/28/2017  . Atherosclerosis of native artery of both lower extremities ([place]) 09/28/2015  . Bilateral carotid artery stenosis 09/28/2015  . Boil of buttock 02/28/2017   Overview:  right  . Cancer ([place]) 02/28/2017  . Colitis 02/28/2017  . Coronary artery disease 02/28/2017   Overview:  history of coronary stents  . Dehydration 02/28/2017  . Depression 02/28/2017  . Diabetes mellitus ([place]) 02/28/2017  . GERD (gastroesophageal reflux disease) 02/28/2017  . Hypercholesteremia 02/28/2017  . Hypertension 02/28/2017  . Hypotension 02/28/2017   [facility] 02/19/17  . Incisional hernia, incarcerated 02/28/2017  . Left bundle branch block 02/28/2017  . 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 MI (myocardial infarction) ([place]) 02/28/2017  . Osteoarthritis of spine 02/28/2017  . PVD (peripheral vascular disease) ([place]) 09/28/2015  . UTI (urinary tract infection) 02/28/2017  . Ventral hernia 09/28/2015    Past Surgical History:  Procedure Laterality Date  . ABDOMINAL AORTIC ANEURYSM REPAIR     open  . ABDOMINAL HERNIA REPAIR     with dehiscence and chronic wound  . ABDOMINAL HYSTERECTOMY    . CARDIAC CATHETERIZATION      Current Medications: No outpatient medications have been marked as taking for the 06/02/17 encounter (Appointment) with Richardo Priest, MD.     Allergies:   Tape   Social History   Socioeconomic History  . Marital status: Married    Spouse name: Not on file  .  Number of children: Not on file  . Years of education: Not on file  . Highest education level: Not on file  Social Needs  . Financial resource strain: Not on file  . Food insecurity - worry: Not on file  . Food insecurity - inability: Not on file  . Transportation needs - medical: Not on file  . Transportation needs - non-medical: Not on file  Occupational History  . Not on file  Tobacco Use  . Smoking status: Former Smoker    Last attempt to  quit: 1999    Years since quitting: 20.0  . Smokeless tobacco: Never Used  Substance and Sexual Activity  . Alcohol use: No  . Drug use: No  . Sexual activity: Not on file  Other Topics Concern  . Not on file  Social History Narrative  . Not on file     Family History: The patient's family history includes Congestive Heart Failure in her mother and sister; Diabetes in her brother, mother, and sister; Hypertension in her mother and sister; Stroke in her mother. ROS:   Please see the history of present illness.    All other systems reviewed and are negative.  EKGs/Labs/Other Studies Reviewed:    The following studies were reviewed today:    Recent Labs: 03/03/2017: Hemoglobin 10.8; Platelets 457  Recent Lipid Panel No results found for: CHOL, TRIG, HDL, CHOLHDL, VLDL, LDLCALC, LDLDIRECT  Physical Exam:    VS:  There were no vitals taken for this visit.    Wt Readings from Last 3 Encounters:  03/03/17 136 lb 12.8 oz (62.1 kg)     GEN:  Well nourished, well developed in no acute distress HEENT: Normal NECK: No JVD; No carotid bruits LYMPHATICS: No lymphadenopathy CARDIAC: RRR, no murmurs, rubs, gallops RESPIRATORY:  Clear to auscultation without rales, wheezing or rhonchi  ABDOMEN: Soft, non-tender, non-distended MUSCULOSKELETAL:  No edema; No deformity  SKIN: Warm and dry NEUROLOGIC:  Alert and oriented x 3 PSYCHIATRIC:  Normal affect    Signed, Shirlee More, MD  06/01/2017 5:00 PM    [place]

## 2017-06-02 ENCOUNTER — Ambulatory Visit (INDEPENDENT_AMBULATORY_CARE_PROVIDER_SITE_OTHER): Payer: Medicare Other | Admitting: Cardiology

## 2017-06-02 ENCOUNTER — Encounter: Payer: Self-pay | Admitting: Cardiology

## 2017-06-02 VITALS — BP 120/62 | HR 69 | Ht 60.0 in | Wt 141.0 lb

## 2017-06-02 DIAGNOSIS — E7800 Pure hypercholesterolemia, unspecified: Secondary | ICD-10-CM | POA: Diagnosis not present

## 2017-06-02 DIAGNOSIS — I25119 Atherosclerotic heart disease of native coronary artery with unspecified angina pectoris: Secondary | ICD-10-CM | POA: Diagnosis not present

## 2017-06-02 DIAGNOSIS — I10 Essential (primary) hypertension: Secondary | ICD-10-CM

## 2017-06-02 NOTE — Patient Instructions (Signed)
Medication Instructions:  Your physician recommends that you continue on your current medications as directed. Please refer to the Current Medication list given to you today.  Labwork: None  Testing/Procedures: Your physician has requested that you have en exercise stress myoview. For further information please visit [email or web address]. Please follow instruction sheet, as given.   Follow-Up: Your physician recommends that you schedule a follow-up appointment in: July, 2019  Any Other Special Instructions Will Be Listed Below (If Applicable).     If you need a refill on your cardiac medications before your next appointment, please call your pharmacy.   [place], RN, BSN

## 2017-06-08 ENCOUNTER — Telehealth (HOSPITAL_COMMUNITY): Payer: Self-pay | Admitting: *Deleted

## 2017-06-08 NOTE — Telephone Encounter (Signed)
Patient given detailed instructions per Myocardial Perfusion Study Information Sheet for the test on 06/13/17 at 9:45. Patient notified to arrive 15 minutes early and that it is imperative to arrive on time for appointment to keep from having the test rescheduled.  If you need to cancel or reschedule your appointment, please call the office within 24 hours of your appointment. . Patient verbalized understanding.Marisa Callahan

## 2017-06-13 ENCOUNTER — Ambulatory Visit (HOSPITAL_COMMUNITY): Payer: Medicare Other | Attending: Cardiology

## 2017-06-13 VITALS — Ht 60.0 in | Wt 141.0 lb

## 2017-06-13 DIAGNOSIS — I200 Unstable angina: Secondary | ICD-10-CM

## 2017-06-13 DIAGNOSIS — I25119 Atherosclerotic heart disease of native coronary artery with unspecified angina pectoris: Secondary | ICD-10-CM

## 2017-06-13 LAB — MYOCARDIAL PERFUSION IMAGING
CHL CUP NUCLEAR SRS: 11
CHL CUP NUCLEAR SSS: 16
LHR: 0.27
LV dias vol: 93 mL (ref 46–106)
LV sys vol: 45 mL
NUC STRESS TID: 0.96
Peak HR: 105 {beats}/min
Rest HR: 60 {beats}/min
SDS: 5

## 2017-06-13 MED ORDER — TECHNETIUM TC 99M TETROFOSMIN IV KIT
10.5000 | PACK | Freq: Once | INTRAVENOUS | Status: AC | PRN
  Administered 2017-06-13: 10.5 via INTRAVENOUS
  Filled 2017-06-13: qty 11

## 2017-06-13 MED ORDER — TECHNETIUM TC 99M TETROFOSMIN IV KIT
33.0000 | PACK | Freq: Once | INTRAVENOUS | Status: AC | PRN
  Administered 2017-06-13: 33 via INTRAVENOUS
  Filled 2017-06-13: qty 33

## 2017-06-13 MED ORDER — REGADENOSON 0.4 MG/5ML IV SOLN
0.4000 mg | Freq: Once | INTRAVENOUS | Status: AC
Start: 2017-06-13 — End: 2017-06-13
  Administered 2017-06-13: 0.4 mg via INTRAVENOUS

## 2017-06-14 ENCOUNTER — Ambulatory Visit: Payer: Medicare Other | Admitting: Cardiology

## 2017-06-16 NOTE — Progress Notes (Deleted)
Cardiology Office Note:    Date:  06/19/2017   ID:  Marisa Callahan, DOB [birth year], MRN [id number]  PCP:  Cher Nakai, MD  Cardiologist:  Shirlee More, MD    Referring MD: Cher Nakai, MD    ASSESSMENT:    1. Coronary artery disease involving native coronary artery of native heart with angina pectoris ([place])   2. Abnormal myocardial perfusion study    PLAN:    In order of problems listed above:  1. ***   Next appointment: ***   Medication Adjustments/Labs and Tests Ordered: Current medicines are reviewed at length with the patient today.  Concerns regarding medicines are outlined above.  No orders of the defined types were placed in this encounter.  No orders of the defined types were placed in this encounter.   No chief complaint on file.   History of Present Illness:    Marisa Callahan is a 67 y.o. female with a hx of CAD and remote [place] last seen 2 weeks ago.. Compliance with diet, lifestyle and medications: *** Past Medical History:  Diagnosis Date  . Abdominal pain 02/28/2017  . Anxiety 02/28/2017  . Arthritis 02/28/2017  . Atherosclerosis of native artery of both lower extremities ([place]) 09/28/2015  . Bilateral carotid artery stenosis 09/28/2015  . Boil of buttock 02/28/2017   Overview:  right  . Cancer ([place]) 02/28/2017  . Colitis 02/28/2017  . Coronary artery disease 02/28/2017   Overview:  history of coronary stents  . Dehydration 02/28/2017  . Depression 02/28/2017  . Diabetes mellitus ([place]) 02/28/2017  . GERD (gastroesophageal reflux disease) 02/28/2017  . Hypercholesteremia 02/28/2017  . Hypertension 02/28/2017  . Hypotension 02/28/2017   [facility] 02/19/17  . Incisional hernia, incarcerated 02/28/2017  . Left bundle branch block 02/28/2017  . 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 MI (myocardial infarction) ([place]) 02/28/2017  . Osteoarthritis of spine 02/28/2017  . PVD (peripheral vascular disease) ([place]) 09/28/2015  . UTI (urinary tract  infection) 02/28/2017  . Ventral hernia 09/28/2015    Past Surgical History:  Procedure Laterality Date  . ABDOMINAL AORTIC ANEURYSM REPAIR     open  . ABDOMINAL HERNIA REPAIR     with dehiscence and chronic wound  . ABDOMINAL HYSTERECTOMY    . CARDIAC CATHETERIZATION      Current Medications: No outpatient medications have been marked as taking for the 06/19/17 encounter (Appointment) with Richardo Priest, MD.     Allergies:   Tape   Social History   Socioeconomic History  . Marital status: Married    Spouse name: Not on file  . Number of children: Not on file  . Years of education: Not on file  . Highest education level: Not on file  Social Needs  . Financial resource strain: Not on file  . Food insecurity - worry: Not on file  . Food insecurity - inability: Not on file  . Transportation needs - medical: Not on file  . Transportation needs - non-medical: Not on file  Occupational History  . Not on file  Tobacco Use  . Smoking status: Former Smoker    Last attempt to quit: 1999    Years since quitting: 20.0  . Smokeless tobacco: Never Used  Substance and Sexual Activity  . Alcohol use: No  . Drug use: No  . Sexual activity: Not on file  Other Topics Concern  . Not on file  Social History Narrative  . Not on file  Family History: The patient's ***family history includes Congestive Heart Failure in her mother and sister; Diabetes in her brother, mother, and sister; Hypertension in her mother and sister; Stroke in her mother. ROS:   Please see the history of present illness.    All other systems reviewed and are negative.  EKGs/Labs/Other Studies Reviewed:    The following studies were reviewed today:  EKG:  EKG ordered today.  The ekg ordered today demonstrates *** MPI:  Stress Perfusion There is a defect present in the basal anteroseptal, basal inferoseptal, basal inferior, mid anteroseptal, mid inferoseptal, mid inferior, apical septal and apical  inferior location.  Perfusion Summary Defect 1: There is a large defect present in the basal anteroseptal, basal inferoseptal, mid anteroseptal, mid inferoseptal and apical septal location. The defect is non-reversible.  Defect 2: There is a medium defect of moderate severity present in the basal inferior, mid inferior and apical inferior location. The defect is reversible.  Overall Study Impression Myocardial perfusion is abnormal. This is a high risk study. Overall left ventricular systolic function was abnormal. LV cavity size is normal. Nuclear stress EF: 51%. The left ventricular ejection fraction is mildly decreased (45-54%).     Recent Labs: 03/03/2017: Hemoglobin 10.8; Platelets 457  Recent Lipid Panel No results found for: CHOL, TRIG, HDL, CHOLHDL, VLDL, LDLCALC, LDLDIRECT  Physical Exam:    VS:  There were no vitals taken for this visit.    Wt Readings from Last 3 Encounters:  06/13/17 141 lb (64 kg)  06/02/17 141 lb (64 kg)  03/03/17 136 lb 12.8 oz (62.1 kg)     GEN: *** Well nourished, well developed in no acute distress HEENT: Normal NECK: No JVD; No carotid bruits LYMPHATICS: No lymphadenopathy CARDIAC: ***RRR, no murmurs, rubs, gallops RESPIRATORY:  Clear to auscultation without rales, wheezing or rhonchi  ABDOMEN: Soft, non-tender, non-distended MUSCULOSKELETAL:  No edema; No deformity  SKIN: Warm and dry NEUROLOGIC:  Alert and oriented x 3 PSYCHIATRIC:  Normal affect    Signed, Shirlee More, MD  06/19/2017 8:26 AM    [place]

## 2017-06-19 ENCOUNTER — Ambulatory Visit: Payer: Medicare Other | Admitting: Cardiology

## 2017-06-19 ENCOUNTER — Telehealth: Payer: Self-pay

## 2017-06-19 DIAGNOSIS — R9439 Abnormal result of other cardiovascular function study: Secondary | ICD-10-CM | POA: Insufficient documentation

## 2017-06-19 HISTORY — DX: Abnormal result of other cardiovascular function study: R94.39

## 2017-06-19 NOTE — Telephone Encounter (Signed)
Patient advised to reschedule appointment that was cancelled today within the next month. Patient is taking care of her daughter who recently had surgery. Patient understands to schedule an appointment to discuss abnormal stress test as soon as she can.

## 2017-06-21 NOTE — Progress Notes (Signed)
Cardiology Office Note:    Date:  06/22/2017   ID:  Marisa Callahan, DOB Sep 17, 1950, MRN [id number]  PCP:  Cher Nakai, MD  Cardiologist:  Shirlee More, MD    Referring MD: Cher Nakai, MD    ASSESSMENT:    1. Coronary artery disease involving native coronary artery of native heart with angina pectoris ([place])    PLAN:    In order of problems listed above:  1. At this time I would advise ongoing medical treatment she will resume her clopidogrel plus aspirin start a calcium channel blocker his antianginal therapy and plan to reassess in the office in July.  If she develops frequent angina we could consider cardiac interventions but I am concerned that her ischemic colitis and GI bleed puts her at risk for adverse consequence from stent and prolonged dual antiplatelet therapy.  She will continue her current lipid-lowering therapy with Zetia and a high intensity statin.   Next appointment: July 2019   Medication Adjustments/Labs and Tests Ordered: Current medicines are reviewed at length with the patient today.  Concerns regarding medicines are outlined above.  No orders of the defined types were placed in this encounter.  Meds ordered this encounter  Medications  . diltiazem (CARDIZEM CD) 120 MG 24 hr capsule    Sig: Take 1 capsule (120 mg total) by mouth daily.    Dispense:  30 capsule    Refill:  3  . clopidogrel (PLAVIX) 75 MG tablet    Sig: Take 1 tablet (75 mg total) by mouth daily.    Dispense:  30 tablet    Refill:  3    Chief Complaint  Patient presents with  . Follow-up    Stress test  . Coronary Artery Disease    History of Present Illness:    Marisa Callahan is a 67 y.o. female with a hx of CAD and remote [place], PAD with recent  ischemic colitis last seen 06/02/17.. Compliance with diet, lifestyle and medications: Yes She is having no anginal discomfort and her abdominal pain is resolved. I reviewed her myocardial perfusion study her calculated ejection fraction  is low normal 51% and she has single vessel ischemic defect.  At this time I would continue medical treatment resume dual antiplatelet therapy start a rate limiting calcium channel blocker continue combined lipid-lowering therapy with Zetia and statin.  If she is having frequent angina this is a clinical marker to consider cardiac interventions and revascularization.  I reviewed in detail with the patient and her husband and they are in agreement. Past Medical History:  Diagnosis Date  . Abdominal pain 02/28/2017  . Anxiety 02/28/2017  . Arthritis 02/28/2017  . Atherosclerosis of native artery of both lower extremities ([place]) 09/28/2015  . Bilateral carotid artery stenosis 09/28/2015  . Boil of buttock 02/28/2017   Overview:  right  . Cancer ([place]) 02/28/2017  . Colitis 02/28/2017  . Coronary artery disease 02/28/2017   Overview:  history of coronary stents  . Dehydration 02/28/2017  . Depression 02/28/2017  . Diabetes mellitus ([place]) 02/28/2017  . GERD (gastroesophageal reflux disease) 02/28/2017  . Hypercholesteremia 02/28/2017  . Hypertension 02/28/2017  . Hypotension 02/28/2017   [facility] 02/19/17  . Incisional hernia, incarcerated 02/28/2017  . Left bundle branch block 02/28/2017  . 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 MI (myocardial infarction) ([place]) 02/28/2017  . Osteoarthritis of spine 02/28/2017  . PVD (peripheral vascular disease) ([place]) 09/28/2015  . UTI (urinary tract infection)  02/28/2017  . Ventral hernia 09/28/2015    Past Surgical History:  Procedure Laterality Date  . ABDOMINAL AORTIC ANEURYSM REPAIR     open  . ABDOMINAL HERNIA REPAIR     with dehiscence and chronic wound  . ABDOMINAL HYSTERECTOMY    . CARDIAC CATHETERIZATION      Current Medications: Current Meds  Medication Sig  . aspirin EC 81 MG tablet Take 81 mg by mouth at bedtime.   Marland Kitchen atorvastatin (LIPITOR) 40 MG tablet Take 40 mg by mouth at bedtime.  . calcium carbonate (CALCIUM 600) 600 MG  TABS tablet Take 600 mg by mouth 2 (two) times daily with a meal.  . citalopram (CELEXA) 20 MG tablet Take 20 mg by mouth daily.  Marland Kitchen ezetimibe (ZETIA) 10 MG tablet Take 10 mg by mouth daily.  . folic acid (FOLVITE) 1 MG tablet Take 1 mg by mouth daily.  . metFORMIN (GLUCOPHAGE) 850 MG tablet Take 850 mg by mouth at bedtime.  . nitroGLYCERIN (NITROSTAT) 0.4 MG SL tablet Place 1 tablet (0.4 mg total) under the tongue every 5 (five) minutes as needed for chest pain.  . pantoprazole (PROTONIX) 40 MG tablet Take 40 mg by mouth daily.  . Probiotic Product (PROBIOTIC DAILY PO) Take 1 capsule by mouth daily.     Allergies:   Tape   Social History   Socioeconomic History  . Marital status: Married    Spouse name: None  . Number of children: None  . Years of education: None  . Highest education level: None  Social Needs  . Financial resource strain: None  . Food insecurity - worry: None  . Food insecurity - inability: None  . Transportation needs - medical: None  . Transportation needs - non-medical: None  Occupational History  . None  Tobacco Use  . Smoking status: Former Smoker    Last attempt to quit: 1999    Years since quitting: 20.0  . Smokeless tobacco: Never Used  Substance and Sexual Activity  . Alcohol use: No  . Drug use: No  . Sexual activity: None  Other Topics Concern  . None  Social History Narrative  . None     Family History: The patient's family history includes Congestive Heart Failure in her mother and sister; Diabetes in her brother, mother, and sister; Hypertension in her mother and sister; Stroke in her mother. ROS:   Please see the history of present illness.    All other systems reviewed and are negative.  EKGs/Labs/Other Studies Reviewed:    The following studies were reviewed today: MPI 2016-07-11: Study Highlights    Nuclear stress EF: 51%.  There was no ST segment deviation noted during stress.  Defect 1: There is a large defect present in  the basal anteroseptal, basal inferoseptal, mid anteroseptal, mid inferoseptal and apical septal location.  Defect 2: There is a medium defect of moderate severity present in the basal inferior, mid inferior and apical inferior location.  This is a high risk study.  The left ventricular ejection fraction is mildly decreased (45-54%).    Recent Labs: 03/03/2017: Hemoglobin 10.8; Platelets 457  Recent Lipid Panel No results found for: CHOL, TRIG, HDL, CHOLHDL, VLDL, LDLCALC, LDLDIRECT  Physical Exam:    VS:  BP 140/60 (BP Location: Right Arm, Patient Position: Sitting, Cuff Size: Normal)   Pulse 86   Ht 5' (1.524 m)   Wt 137 lb (62.1 kg)   SpO2 98%   BMI 26.76 kg/m  Wt Readings from Last 3 Encounters:  06/22/17 137 lb (62.1 kg)  06/13/17 141 lb (64 kg)  06/02/17 141 lb (64 kg)     GEN:  Well nourished, well developed in no acute distress HEENT: Normal NECK: No JVD; No carotid bruits LYMPHATICS: No lymphadenopathy CARDIAC: RRR, no murmurs, rubs, gallops RESPIRATORY:  Clear to auscultation without rales, wheezing or rhonchi  ABDOMEN: Soft, non-tender, non-distended MUSCULOSKELETAL:  No edema; No deformity  SKIN: Warm and dry NEUROLOGIC:  Alert and oriented x 3 PSYCHIATRIC:  Normal affect    Signed, Shirlee More, MD  06/22/2017 3:22 PM    [facility]

## 2017-06-22 ENCOUNTER — Encounter: Payer: Self-pay | Admitting: Cardiology

## 2017-06-22 ENCOUNTER — Ambulatory Visit (INDEPENDENT_AMBULATORY_CARE_PROVIDER_SITE_OTHER): Payer: Medicare Other | Admitting: Cardiology

## 2017-06-22 VITALS — BP 140/60 | HR 86 | Ht 60.0 in | Wt 137.0 lb

## 2017-06-22 DIAGNOSIS — I25119 Atherosclerotic heart disease of native coronary artery with unspecified angina pectoris: Secondary | ICD-10-CM

## 2017-06-22 MED ORDER — CLOPIDOGREL BISULFATE 75 MG PO TABS: 75.0000 mg | ORAL_TABLET | Freq: Every day | ORAL | 3 refills | Status: DC

## 2017-06-22 MED ORDER — DILTIAZEM HCL ER COATED BEADS 120 MG PO CP24
120.0000 mg | ORAL_CAPSULE | Freq: Every day | ORAL | 3 refills | Status: DC
Start: 2017-06-22 — End: 2017-08-23

## 2017-06-22 NOTE — Patient Instructions (Signed)
Medication Instructions:  Your physician has recommended you make the following change in your medication:  ReStart the Clopidogrel 75 mg daily, continue with the aspirin.  Start Cardizem CD 120 mg. Once per day   Labwork: None  Testing/Procedures: None  Follow-Up: Your physician wants you to follow-up in July, 2019.  You will receive a reminder letter in the mail two months in advance. If you don't receive a letter, please call our office to schedule the follow-up appointment.   Any Other Special Instructions Will Be Listed Below (If Applicable).     If you need a refill on your cardiac medications before your next appointment, please call your pharmacy.

## 2017-07-14 DIAGNOSIS — E7800 Pure hypercholesterolemia, unspecified: Secondary | ICD-10-CM | POA: Diagnosis not present

## 2017-07-14 DIAGNOSIS — K219 Gastro-esophageal reflux disease without esophagitis: Secondary | ICD-10-CM | POA: Diagnosis not present

## 2017-07-14 DIAGNOSIS — E1159 Type 2 diabetes mellitus with other circulatory complications: Secondary | ICD-10-CM | POA: Diagnosis not present

## 2017-07-14 DIAGNOSIS — M8580 Other specified disorders of bone density and structure, unspecified site: Secondary | ICD-10-CM | POA: Diagnosis not present

## 2017-07-14 DIAGNOSIS — Z6827 Body mass index (BMI) 27.0-27.9, adult: Secondary | ICD-10-CM | POA: Diagnosis not present

## 2017-07-14 DIAGNOSIS — M109 Gout, unspecified: Secondary | ICD-10-CM | POA: Diagnosis not present

## 2017-07-14 DIAGNOSIS — F419 Anxiety disorder, unspecified: Secondary | ICD-10-CM | POA: Diagnosis not present

## 2017-07-14 DIAGNOSIS — I10 Essential (primary) hypertension: Secondary | ICD-10-CM | POA: Diagnosis not present

## 2017-07-14 DIAGNOSIS — I2510 Atherosclerotic heart disease of native coronary artery without angina pectoris: Secondary | ICD-10-CM | POA: Diagnosis not present

## 2017-07-14 DIAGNOSIS — F329 Major depressive disorder, single episode, unspecified: Secondary | ICD-10-CM | POA: Diagnosis not present

## 2017-07-14 DIAGNOSIS — I739 Peripheral vascular disease, unspecified: Secondary | ICD-10-CM | POA: Diagnosis not present

## 2017-08-10 DIAGNOSIS — I10 Essential (primary) hypertension: Secondary | ICD-10-CM | POA: Diagnosis not present

## 2017-08-10 DIAGNOSIS — F329 Major depressive disorder, single episode, unspecified: Secondary | ICD-10-CM | POA: Diagnosis not present

## 2017-08-10 DIAGNOSIS — K219 Gastro-esophageal reflux disease without esophagitis: Secondary | ICD-10-CM | POA: Diagnosis not present

## 2017-08-10 DIAGNOSIS — I2510 Atherosclerotic heart disease of native coronary artery without angina pectoris: Secondary | ICD-10-CM | POA: Diagnosis not present

## 2017-08-10 DIAGNOSIS — I739 Peripheral vascular disease, unspecified: Secondary | ICD-10-CM | POA: Diagnosis not present

## 2017-08-10 DIAGNOSIS — F419 Anxiety disorder, unspecified: Secondary | ICD-10-CM | POA: Diagnosis not present

## 2017-08-10 DIAGNOSIS — M109 Gout, unspecified: Secondary | ICD-10-CM | POA: Diagnosis not present

## 2017-08-10 DIAGNOSIS — M8580 Other specified disorders of bone density and structure, unspecified site: Secondary | ICD-10-CM | POA: Diagnosis not present

## 2017-08-10 DIAGNOSIS — E1159 Type 2 diabetes mellitus with other circulatory complications: Secondary | ICD-10-CM | POA: Diagnosis not present

## 2017-08-10 DIAGNOSIS — E7800 Pure hypercholesterolemia, unspecified: Secondary | ICD-10-CM | POA: Diagnosis not present

## 2017-08-10 DIAGNOSIS — Z6827 Body mass index (BMI) 27.0-27.9, adult: Secondary | ICD-10-CM | POA: Diagnosis not present

## 2017-08-18 DIAGNOSIS — Z1331 Encounter for screening for depression: Secondary | ICD-10-CM | POA: Diagnosis not present

## 2017-08-18 DIAGNOSIS — Z9181 History of falling: Secondary | ICD-10-CM | POA: Diagnosis not present

## 2017-08-18 DIAGNOSIS — Z0000 Encounter for general adult medical examination without abnormal findings: Secondary | ICD-10-CM | POA: Diagnosis not present

## 2017-08-18 DIAGNOSIS — E785 Hyperlipidemia, unspecified: Secondary | ICD-10-CM | POA: Diagnosis not present

## 2017-08-18 DIAGNOSIS — Z1231 Encounter for screening mammogram for malignant neoplasm of breast: Secondary | ICD-10-CM | POA: Diagnosis not present

## 2017-08-23 ENCOUNTER — Other Ambulatory Visit: Payer: Self-pay

## 2017-08-23 MED ORDER — DILTIAZEM HCL ER COATED BEADS 120 MG PO CP24: 120.0000 mg | ORAL_CAPSULE | Freq: Every day | ORAL | 3 refills | Status: DC

## 2017-08-23 NOTE — Telephone Encounter (Signed)
Rx faxed to pharmacy  

## 2017-09-14 DIAGNOSIS — F329 Major depressive disorder, single episode, unspecified: Secondary | ICD-10-CM | POA: Diagnosis not present

## 2017-09-14 DIAGNOSIS — M109 Gout, unspecified: Secondary | ICD-10-CM | POA: Diagnosis not present

## 2017-09-14 DIAGNOSIS — K219 Gastro-esophageal reflux disease without esophagitis: Secondary | ICD-10-CM | POA: Diagnosis not present

## 2017-09-14 DIAGNOSIS — I739 Peripheral vascular disease, unspecified: Secondary | ICD-10-CM | POA: Diagnosis not present

## 2017-09-14 DIAGNOSIS — M8580 Other specified disorders of bone density and structure, unspecified site: Secondary | ICD-10-CM | POA: Diagnosis not present

## 2017-09-14 DIAGNOSIS — E7800 Pure hypercholesterolemia, unspecified: Secondary | ICD-10-CM | POA: Diagnosis not present

## 2017-09-14 DIAGNOSIS — M25512 Pain in left shoulder: Secondary | ICD-10-CM | POA: Diagnosis not present

## 2017-09-14 DIAGNOSIS — M25561 Pain in right knee: Secondary | ICD-10-CM | POA: Diagnosis not present

## 2017-09-14 DIAGNOSIS — I10 Essential (primary) hypertension: Secondary | ICD-10-CM | POA: Diagnosis not present

## 2017-09-14 DIAGNOSIS — E1159 Type 2 diabetes mellitus with other circulatory complications: Secondary | ICD-10-CM | POA: Diagnosis not present

## 2017-09-14 DIAGNOSIS — I2510 Atherosclerotic heart disease of native coronary artery without angina pectoris: Secondary | ICD-10-CM | POA: Diagnosis not present

## 2017-09-14 DIAGNOSIS — F419 Anxiety disorder, unspecified: Secondary | ICD-10-CM | POA: Diagnosis not present

## 2017-10-11 DIAGNOSIS — I739 Peripheral vascular disease, unspecified: Secondary | ICD-10-CM | POA: Diagnosis not present

## 2017-10-11 DIAGNOSIS — F419 Anxiety disorder, unspecified: Secondary | ICD-10-CM | POA: Diagnosis not present

## 2017-10-11 DIAGNOSIS — I10 Essential (primary) hypertension: Secondary | ICD-10-CM | POA: Diagnosis not present

## 2017-10-11 DIAGNOSIS — E7800 Pure hypercholesterolemia, unspecified: Secondary | ICD-10-CM | POA: Diagnosis not present

## 2017-10-11 DIAGNOSIS — I2510 Atherosclerotic heart disease of native coronary artery without angina pectoris: Secondary | ICD-10-CM | POA: Diagnosis not present

## 2017-10-11 DIAGNOSIS — E1159 Type 2 diabetes mellitus with other circulatory complications: Secondary | ICD-10-CM | POA: Diagnosis not present

## 2017-11-14 ENCOUNTER — Telehealth: Payer: Self-pay

## 2017-11-14 MED ORDER — CLOPIDOGREL BISULFATE 75 MG PO TABS: 75.0000 mg | ORAL_TABLET | Freq: Every day | ORAL | 11 refills | Status: DC

## 2017-11-14 NOTE — Telephone Encounter (Signed)
Rx sent to pharmacy as requested.

## 2017-11-20 DIAGNOSIS — Z1231 Encounter for screening mammogram for malignant neoplasm of breast: Secondary | ICD-10-CM | POA: Diagnosis not present

## 2017-12-20 DIAGNOSIS — I70203 Unspecified atherosclerosis of native arteries of extremities, bilateral legs: Secondary | ICD-10-CM | POA: Diagnosis not present

## 2017-12-20 DIAGNOSIS — I6523 Occlusion and stenosis of bilateral carotid arteries: Secondary | ICD-10-CM | POA: Diagnosis not present

## 2017-12-20 DIAGNOSIS — I739 Peripheral vascular disease, unspecified: Secondary | ICD-10-CM | POA: Diagnosis not present

## 2017-12-20 DIAGNOSIS — Z95820 Peripheral vascular angioplasty status with implants and grafts: Secondary | ICD-10-CM | POA: Diagnosis not present

## 2018-01-07 NOTE — Progress Notes (Signed)
Cardiology Office Note:    Date:  01/08/2018   ID:  Marisa Callahan, DOB 1951-05-19, MRN [id number]  PCP:  Cher Nakai, MD  Cardiologist:  Shirlee More, MD    Referring MD: Cher Nakai, MD    ASSESSMENT:    1. Coronary artery disease of native artery of native heart with stable angina pectoris ([place])   2. Essential hypertension   3. Left bundle branch block   4. Hypercholesteremia    PLAN:    In order of problems listed above:  1. Stable she has had no anginal discomfort continue her current medical treatment at this time I would not advise revascularization especially with her ischemic colitis and previous GI bleed.  She will continue her current medical regimen including aspirin clopidogrel calcium channel blocker and lipid-lowering therapy I will plan to see her back in the office in 1 year or sooner if she is having frequent angina 2. Stable blood pressure target continue current treatment calcium channel blocker 3. Stable EKG pattern 4. Continue high intensity statin plus Zetia await labs to be drawn the next few weeks with her PCP for liver function safety LDL cholesterol efficacy goals of LDL less than 70   Next appointment: In 1 year   Medication Adjustments/Labs and Tests Ordered: Current medicines are reviewed at length with the patient today.  Concerns regarding medicines are outlined above.  No orders of the defined types were placed in this encounter.  No orders of the defined types were placed in this encounter.   Chief Complaint  Patient presents with  . Coronary Artery Disease  . Diabetes Mellitus  . Hyperlipidemia  . Hypertension    History of Present Illness:    Marisa Callahan is a 67 y.o. female with a hx of CAD with PCI 1999 Type 2 DM hypertension hyperlipidemia carotid stenosis and open AAA surgery and aorto- bifemoral bypass in 2011 last 06/22/17 after an episode of ischemic colitis with GI bleed. Her MPI 06/13/16 showed EF 41% and 1 vessel  ischemia-LAD. Compliance with diet, lifestyle and medications: Yes  She is doing well she is recovered from ischemic colitis she said no anginal discomfort shortness of breath palpitation or syncope tolerates medical therapy and is planned for follow-up with PCP in few weeks for diabetes hypertension hyperlipidemia and will have renal function liver function lipid profile checked Past Medical History:  Diagnosis Date  . Abdominal pain 02/28/2017  . Anxiety 02/28/2017  . Arthritis 02/28/2017  . Atherosclerosis of native artery of both lower extremities ([place]) 09/28/2015  . Bilateral carotid artery stenosis 09/28/2015  . Boil of buttock 02/28/2017   Overview:  right  . Cancer ([place]) 02/28/2017  . Colitis 02/28/2017  . Coronary artery disease 02/28/2017   Overview:  history of coronary stents  . Dehydration 02/28/2017  . Depression 02/28/2017  . Diabetes mellitus ([place]) 02/28/2017  . GERD (gastroesophageal reflux disease) 02/28/2017  . Hypercholesteremia 02/28/2017  . Hypertension 02/28/2017  . Hypotension 02/28/2017   [facility] 02/19/17  . Incisional hernia, incarcerated 02/28/2017  . Left bundle branch block 02/28/2017  . 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 MI (myocardial infarction) ([place]) 02/28/2017  . Osteoarthritis of spine 02/28/2017  . PVD (peripheral vascular disease) ([place]) 09/28/2015  . UTI (urinary tract infection) 02/28/2017  . Ventral hernia 09/28/2015    Past Surgical History:  Procedure Laterality Date  . ABDOMINAL AORTIC ANEURYSM REPAIR     open  . ABDOMINAL HERNIA REPAIR  with dehiscence and chronic wound  . ABDOMINAL HYSTERECTOMY    . BYPASS GRAFT    . CARDIAC CATHETERIZATION      Current Medications: Current Meds  Medication Sig  . aspirin EC 81 MG tablet Take 81 mg by mouth at bedtime.   Marland Kitchen atorvastatin (LIPITOR) 40 MG tablet Take 40 mg by mouth at bedtime.  . calcium carbonate (CALCIUM 600) 600 MG TABS tablet Take 600 mg by mouth 2 (two) times  daily with a meal.  . citalopram (CELEXA) 20 MG tablet Take 20 mg by mouth daily.  . clopidogrel (PLAVIX) 75 MG tablet Take 1 tablet (75 mg total) by mouth daily.  Marland Kitchen diltiazem (CARDIZEM CD) 120 MG 24 hr capsule Take 1 capsule (120 mg total) by mouth daily.  Marland Kitchen ezetimibe (ZETIA) 10 MG tablet Take 10 mg by mouth daily.  . folic acid (FOLVITE) 1 MG tablet Take 1 mg by mouth daily.  . metFORMIN (GLUCOPHAGE) 850 MG tablet Take 850 mg by mouth at bedtime.  . nitroGLYCERIN (NITROSTAT) 0.4 MG SL tablet Place 1 tablet (0.4 mg total) under the tongue every 5 (five) minutes as needed for chest pain.  . pantoprazole (PROTONIX) 40 MG tablet Take 40 mg by mouth daily.  . Probiotic Product (PROBIOTIC DAILY PO) Take 1 capsule by mouth daily.     Allergies:   Tape   Social History   Socioeconomic History  . Marital status: Married    Spouse name: Not on file  . Number of children: Not on file  . Years of education: Not on file  . Highest education level: Not on file  Occupational History  . Not on file  Social Needs  . Financial resource strain: Not on file  . Food insecurity:    Worry: Not on file    Inability: Not on file  . Transportation needs:    Medical: Not on file    Non-medical: Not on file  Tobacco Use  . Smoking status: Former Smoker    Last attempt to quit: 1999    Years since quitting: 20.6  . Smokeless tobacco: Never Used  Substance and Sexual Activity  . Alcohol use: No  . Drug use: No  . Sexual activity: Not on file  Lifestyle  . Physical activity:    Days per week: Not on file    Minutes per session: Not on file  . Stress: Not on file  Relationships  . Social connections:    Talks on phone: Not on file    Gets together: Not on file    Attends religious service: Not on file    Active member of club or organization: Not on file    Attends meetings of clubs or organizations: Not on file    Relationship status: Not on file  Other Topics Concern  . Not on file   Social History Narrative  . Not on file     Family History: The patient's family history includes Congestive Heart Failure in her mother and sister; Diabetes in her brother, mother, and sister; Hypertension in her mother and sister; Stroke in her mother. ROS:   Please see the history of present illness.    All other systems reviewed and are negative.  EKGs/Labs/Other Studies Reviewed:    The following studies were reviewed today:  EKG:  EKG ordered today.  The ekg ordered today demonstrates [place] LBBB unchanged   Recent Labs: 03/03/2017: Hemoglobin 10.8; Platelets 457  Recent Lipid Panel No results found for:  CHOL, TRIG, HDL, CHOLHDL, VLDL, LDLCALC, LDLDIRECT  Physical Exam:    VS:  BP 124/76 (BP Location: Right Arm, Patient Position: Sitting, Cuff Size: Normal)   Pulse 73   Ht 5' (1.524 m)   Wt 136 lb (61.7 kg)   SpO2 98%   BMI 26.56 kg/m     Wt Readings from Last 3 Encounters:  01/08/18 136 lb (61.7 kg)  06/22/17 137 lb (62.1 kg)  06/13/17 141 lb (64 kg)     GEN:  Well nourished, well developed in no acute distress HEENT: Normal NECK: No JVD; No carotid bruits LYMPHATICS: No lymphadenopathy CARDIAC: RRR, no murmurs, rubs, gallops RESPIRATORY:  Clear to auscultation without rales, wheezing or rhonchi  ABDOMEN: Soft, non-tender, non-distended MUSCULOSKELETAL:  No edema; No deformity  SKIN: Warm and dry NEUROLOGIC:  Alert and oriented x 3 PSYCHIATRIC:  Normal affect    Signed, Shirlee More, MD  01/08/2018 8:34 AM    [place]

## 2018-01-08 ENCOUNTER — Encounter: Payer: Self-pay | Admitting: Cardiology

## 2018-01-08 ENCOUNTER — Ambulatory Visit (INDEPENDENT_AMBULATORY_CARE_PROVIDER_SITE_OTHER): Payer: Medicare Other | Admitting: Cardiology

## 2018-01-08 VITALS — BP 124/76 | HR 73 | Ht 60.0 in | Wt 136.0 lb

## 2018-01-08 DIAGNOSIS — I10 Essential (primary) hypertension: Secondary | ICD-10-CM | POA: Diagnosis not present

## 2018-01-08 DIAGNOSIS — I447 Left bundle-branch block, unspecified: Secondary | ICD-10-CM

## 2018-01-08 DIAGNOSIS — E7800 Pure hypercholesterolemia, unspecified: Secondary | ICD-10-CM | POA: Diagnosis not present

## 2018-01-08 DIAGNOSIS — I25118 Atherosclerotic heart disease of native coronary artery with other forms of angina pectoris: Secondary | ICD-10-CM | POA: Diagnosis not present

## 2018-01-08 NOTE — Patient Instructions (Signed)
Medication Instructions:  Your physician recommends that you continue on your current medications as directed. Please refer to the Current Medication list given to you today.   Labwork: NONE  Testing/Procedures: You had an EKG today   Follow-Up: Your physician wants you to follow-up in: 1 year.   You will receive a reminder letter in the mail two months in advance. If you don't receive a letter, please call our office to schedule the follow-up appointment.   Any Other Special Instructions Will Be Listed Below (If Applicable).     If you need a refill on your cardiac medications before your next appointment, please call your pharmacy.   

## 2018-01-16 DIAGNOSIS — I2510 Atherosclerotic heart disease of native coronary artery without angina pectoris: Secondary | ICD-10-CM | POA: Diagnosis not present

## 2018-01-16 DIAGNOSIS — I739 Peripheral vascular disease, unspecified: Secondary | ICD-10-CM | POA: Diagnosis not present

## 2018-01-16 DIAGNOSIS — F419 Anxiety disorder, unspecified: Secondary | ICD-10-CM | POA: Diagnosis not present

## 2018-01-16 DIAGNOSIS — I10 Essential (primary) hypertension: Secondary | ICD-10-CM | POA: Diagnosis not present

## 2018-01-16 DIAGNOSIS — E7800 Pure hypercholesterolemia, unspecified: Secondary | ICD-10-CM | POA: Diagnosis not present

## 2018-01-16 DIAGNOSIS — E1159 Type 2 diabetes mellitus with other circulatory complications: Secondary | ICD-10-CM | POA: Diagnosis not present

## 2018-02-12 DIAGNOSIS — Z23 Encounter for immunization: Secondary | ICD-10-CM | POA: Diagnosis not present

## 2018-04-19 DIAGNOSIS — M25511 Pain in right shoulder: Secondary | ICD-10-CM | POA: Diagnosis not present

## 2018-04-19 DIAGNOSIS — I2510 Atherosclerotic heart disease of native coronary artery without angina pectoris: Secondary | ICD-10-CM | POA: Diagnosis not present

## 2018-04-19 DIAGNOSIS — M25551 Pain in right hip: Secondary | ICD-10-CM | POA: Diagnosis not present

## 2018-04-19 DIAGNOSIS — I739 Peripheral vascular disease, unspecified: Secondary | ICD-10-CM | POA: Diagnosis not present

## 2018-04-19 DIAGNOSIS — Z1339 Encounter for screening examination for other mental health and behavioral disorders: Secondary | ICD-10-CM | POA: Diagnosis not present

## 2018-04-19 DIAGNOSIS — I10 Essential (primary) hypertension: Secondary | ICD-10-CM | POA: Diagnosis not present

## 2018-04-19 DIAGNOSIS — E7800 Pure hypercholesterolemia, unspecified: Secondary | ICD-10-CM | POA: Diagnosis not present

## 2018-04-19 DIAGNOSIS — E1159 Type 2 diabetes mellitus with other circulatory complications: Secondary | ICD-10-CM | POA: Diagnosis not present

## 2018-04-25 DIAGNOSIS — E119 Type 2 diabetes mellitus without complications: Secondary | ICD-10-CM | POA: Diagnosis not present

## 2018-07-20 DIAGNOSIS — F419 Anxiety disorder, unspecified: Secondary | ICD-10-CM | POA: Diagnosis not present

## 2018-07-20 DIAGNOSIS — I739 Peripheral vascular disease, unspecified: Secondary | ICD-10-CM | POA: Diagnosis not present

## 2018-07-20 DIAGNOSIS — E7800 Pure hypercholesterolemia, unspecified: Secondary | ICD-10-CM | POA: Diagnosis not present

## 2018-07-20 DIAGNOSIS — E1159 Type 2 diabetes mellitus with other circulatory complications: Secondary | ICD-10-CM | POA: Diagnosis not present

## 2018-07-20 DIAGNOSIS — M109 Gout, unspecified: Secondary | ICD-10-CM | POA: Diagnosis not present

## 2018-07-20 DIAGNOSIS — I10 Essential (primary) hypertension: Secondary | ICD-10-CM | POA: Diagnosis not present

## 2018-07-20 DIAGNOSIS — I2510 Atherosclerotic heart disease of native coronary artery without angina pectoris: Secondary | ICD-10-CM | POA: Diagnosis not present

## 2018-10-17 ENCOUNTER — Other Ambulatory Visit: Payer: Self-pay | Admitting: Cardiology

## 2018-10-18 DIAGNOSIS — I739 Peripheral vascular disease, unspecified: Secondary | ICD-10-CM | POA: Diagnosis not present

## 2018-10-18 DIAGNOSIS — F419 Anxiety disorder, unspecified: Secondary | ICD-10-CM | POA: Diagnosis not present

## 2018-10-18 DIAGNOSIS — M109 Gout, unspecified: Secondary | ICD-10-CM | POA: Diagnosis not present

## 2018-10-18 DIAGNOSIS — I2510 Atherosclerotic heart disease of native coronary artery without angina pectoris: Secondary | ICD-10-CM | POA: Diagnosis not present

## 2018-10-18 DIAGNOSIS — Z9181 History of falling: Secondary | ICD-10-CM | POA: Diagnosis not present

## 2018-11-15 DIAGNOSIS — I2510 Atherosclerotic heart disease of native coronary artery without angina pectoris: Secondary | ICD-10-CM | POA: Diagnosis not present

## 2018-11-15 DIAGNOSIS — F419 Anxiety disorder, unspecified: Secondary | ICD-10-CM | POA: Diagnosis not present

## 2018-11-15 DIAGNOSIS — I739 Peripheral vascular disease, unspecified: Secondary | ICD-10-CM | POA: Diagnosis not present

## 2018-11-15 DIAGNOSIS — Z9181 History of falling: Secondary | ICD-10-CM | POA: Diagnosis not present

## 2018-11-15 DIAGNOSIS — M109 Gout, unspecified: Secondary | ICD-10-CM | POA: Diagnosis not present

## 2018-12-04 ENCOUNTER — Other Ambulatory Visit: Payer: Self-pay | Admitting: Cardiology

## 2018-12-13 DIAGNOSIS — M109 Gout, unspecified: Secondary | ICD-10-CM | POA: Diagnosis not present

## 2018-12-13 DIAGNOSIS — I739 Peripheral vascular disease, unspecified: Secondary | ICD-10-CM | POA: Diagnosis not present

## 2018-12-13 DIAGNOSIS — F419 Anxiety disorder, unspecified: Secondary | ICD-10-CM | POA: Diagnosis not present

## 2018-12-13 DIAGNOSIS — Z9181 History of falling: Secondary | ICD-10-CM | POA: Diagnosis not present

## 2018-12-13 DIAGNOSIS — I2510 Atherosclerotic heart disease of native coronary artery without angina pectoris: Secondary | ICD-10-CM | POA: Diagnosis not present

## 2019-01-02 DIAGNOSIS — Z95828 Presence of other vascular implants and grafts: Secondary | ICD-10-CM | POA: Diagnosis not present

## 2019-01-02 DIAGNOSIS — I739 Peripheral vascular disease, unspecified: Secondary | ICD-10-CM | POA: Diagnosis not present

## 2019-01-02 DIAGNOSIS — I70203 Unspecified atherosclerosis of native arteries of extremities, bilateral legs: Secondary | ICD-10-CM | POA: Diagnosis not present

## 2019-01-02 DIAGNOSIS — I70308 Unspecified atherosclerosis of unspecified type of bypass graft(s) of the extremities, other extremity: Secondary | ICD-10-CM | POA: Diagnosis not present

## 2019-01-02 DIAGNOSIS — I6523 Occlusion and stenosis of bilateral carotid arteries: Secondary | ICD-10-CM | POA: Diagnosis not present

## 2019-01-04 DIAGNOSIS — Z1231 Encounter for screening mammogram for malignant neoplasm of breast: Secondary | ICD-10-CM | POA: Diagnosis not present

## 2019-01-10 DIAGNOSIS — E1159 Type 2 diabetes mellitus with other circulatory complications: Secondary | ICD-10-CM | POA: Diagnosis not present

## 2019-01-10 DIAGNOSIS — F419 Anxiety disorder, unspecified: Secondary | ICD-10-CM | POA: Diagnosis not present

## 2019-01-10 DIAGNOSIS — I2510 Atherosclerotic heart disease of native coronary artery without angina pectoris: Secondary | ICD-10-CM | POA: Diagnosis not present

## 2019-01-10 DIAGNOSIS — I739 Peripheral vascular disease, unspecified: Secondary | ICD-10-CM | POA: Diagnosis not present

## 2019-01-21 ENCOUNTER — Other Ambulatory Visit: Payer: Self-pay | Admitting: Cardiology

## 2019-01-22 DIAGNOSIS — Z139 Encounter for screening, unspecified: Secondary | ICD-10-CM | POA: Diagnosis not present

## 2019-01-22 DIAGNOSIS — Z1331 Encounter for screening for depression: Secondary | ICD-10-CM | POA: Diagnosis not present

## 2019-01-22 DIAGNOSIS — Z9181 History of falling: Secondary | ICD-10-CM | POA: Diagnosis not present

## 2019-01-22 DIAGNOSIS — Z0000 Encounter for general adult medical examination without abnormal findings: Secondary | ICD-10-CM | POA: Diagnosis not present

## 2019-01-22 DIAGNOSIS — E785 Hyperlipidemia, unspecified: Secondary | ICD-10-CM | POA: Diagnosis not present

## 2019-02-07 DIAGNOSIS — F419 Anxiety disorder, unspecified: Secondary | ICD-10-CM | POA: Diagnosis not present

## 2019-02-07 DIAGNOSIS — I739 Peripheral vascular disease, unspecified: Secondary | ICD-10-CM | POA: Diagnosis not present

## 2019-02-07 DIAGNOSIS — I2510 Atherosclerotic heart disease of native coronary artery without angina pectoris: Secondary | ICD-10-CM | POA: Diagnosis not present

## 2019-02-07 DIAGNOSIS — E1159 Type 2 diabetes mellitus with other circulatory complications: Secondary | ICD-10-CM | POA: Diagnosis not present

## 2019-02-13 DIAGNOSIS — Z23 Encounter for immunization: Secondary | ICD-10-CM | POA: Diagnosis not present

## 2019-03-05 ENCOUNTER — Telehealth: Payer: Self-pay | Admitting: Cardiology

## 2019-03-05 MED ORDER — CLOPIDOGREL BISULFATE 75 MG PO TABS: 75.0000 mg | ORAL_TABLET | Freq: Every day | ORAL | 0 refills | Status: DC

## 2019-03-05 NOTE — Addendum Note (Signed)
Addended by: Austin Miles on: 03/05/2019 10:57 AM   Modules accepted: Orders

## 2019-03-05 NOTE — Telephone Encounter (Signed)
Refill for clopidogrel sent to [facility] in [place] as requested.

## 2019-03-05 NOTE — Telephone Encounter (Signed)
°*  STAT* If patient is at the pharmacy, call can be transferred to refill team.   1. Which medications need to be refilled? (please list name of each medication and dose if known) Clopidigrel  2. Which pharmacy/location (including street and city if local pharmacy) is medication to be sent to?[facility] In [place]  3. Do they need a 30 day or 90 day supply? 90  Patient is scheduled for 12/2 due to [facility] schedule out so far.

## 2019-03-07 DIAGNOSIS — E7800 Pure hypercholesterolemia, unspecified: Secondary | ICD-10-CM | POA: Diagnosis not present

## 2019-03-07 DIAGNOSIS — I10 Essential (primary) hypertension: Secondary | ICD-10-CM | POA: Diagnosis not present

## 2019-03-07 DIAGNOSIS — E1159 Type 2 diabetes mellitus with other circulatory complications: Secondary | ICD-10-CM | POA: Diagnosis not present

## 2019-03-07 DIAGNOSIS — F419 Anxiety disorder, unspecified: Secondary | ICD-10-CM | POA: Diagnosis not present

## 2019-03-07 DIAGNOSIS — I2510 Atherosclerotic heart disease of native coronary artery without angina pectoris: Secondary | ICD-10-CM | POA: Diagnosis not present

## 2019-03-07 DIAGNOSIS — I739 Peripheral vascular disease, unspecified: Secondary | ICD-10-CM | POA: Diagnosis not present

## 2019-04-19 ENCOUNTER — Telehealth: Payer: Self-pay | Admitting: Cardiology

## 2019-04-19 MED ORDER — DILTIAZEM HCL ER COATED BEADS 120 MG PO CP24: 120.0000 mg | ORAL_CAPSULE | Freq: Every day | ORAL | 0 refills | Status: DC

## 2019-04-19 NOTE — Telephone Encounter (Signed)
°*  STAT* If patient is at the pharmacy, call can be transferred to refill team.   1. Which medications need to be refilled? (please list name of each medication and dose if known) [place] takes 1 daily   2. Which pharmacy/location (including street and city if local pharmacy) is medication to be sent to? [facility] In [place]  3. Do they need a 30 day or 90 day supply? 30   Patient scheduled in December for [facility]

## 2019-04-19 NOTE — Telephone Encounter (Signed)
Refill for cardizem sent to the [facility] in [place] as requested.

## 2019-04-19 NOTE — Addendum Note (Signed)
Addended by: Austin Miles on: 04/19/2019 12:16 PM   Modules accepted: Orders

## 2019-04-24 DIAGNOSIS — H2513 Age-related nuclear cataract, bilateral: Secondary | ICD-10-CM | POA: Diagnosis not present

## 2019-05-06 NOTE — Progress Notes (Signed)
Cardiology Office Note:    Date:  05/07/2019   ID:  Marisa Callahan, DOB [birth year], MRN [id number]  PCP:  Marisa Nakai, MD  Cardiologist:  Marisa More, MD    Referring MD: Marisa Nakai, MD    ASSESSMENT:    1. Coronary artery disease of native artery of native heart with stable angina pectoris ([place])   2. Essential hypertension   3. Hypercholesteremia   4. Left bundle branch block   5. Bilateral carotid artery stenosis    PLAN:    In order of problems listed above:  1. Stable CAD she is having no angina on current medical treatment New York Heart Association class I continue her long-term dual platelet inhibition aspirin and clopidogrel along with lipid-lowering therapy high intensity statin plus Zetia and rate limiting calcium channel blocker.  At this time I do not think she requires an ischemia evaluation.  She was given a new prescription for nitroglycerin to avoid ED interactions during high prevalence of COVID-19 2. Stable hypertension mean arterial pressure is normal continue current treatment I asked her to screen her blood pressure at least 3 times a week and record numbers 3. Stable hypertension and hyperlipidemia LDL is ideal continue combined therapy high intensity statin and Zetia 4. Stable EKG pattern 5. PAD stable she is followed by vascular surgery Marisa Callahan is on appropriate guideline directed treatment with dual antiplatelet intensive lipid-lowering and antihypertensive.   Next appointment: 6 months   Medication Adjustments/Labs and Tests Ordered: Current medicines are reviewed at length with the patient today.  Concerns regarding medicines are outlined above.  No orders of the defined types were placed in this encounter.  No orders of the defined types were placed in this encounter.   Chief Complaint  Patient presents with   Follow-up   Coronary Artery Disease    History of Present Illness:    Marisa Callahan is a 68 y.o. 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 Her MPI 06/13/16 showed EF 41% and 1 vessel ischemia-LAD.  She was last seen 01/08/2018. Compliance with diet, lifestyle and medications: Yes  She is pleased with the quality of her life has good healthcare literacy and good preventive practices for COVID-19.  Despite the stress of day-to-day life she has done well has had no recurrence of her ischemic colitis no GI bleeding no TIA chest pain shortness of breath palpitation or syncope is followed up with the vascular surgeons. Past Medical History:  Diagnosis Date   Abdominal pain 02/28/2017   Anxiety 02/28/2017   Arthritis 02/28/2017   Atherosclerosis of native artery of both lower extremities ([place]) 09/28/2015   Bilateral carotid artery stenosis 09/28/2015   Boil of buttock 02/28/2017   Overview:  right   Cancer ([place]) 02/28/2017   Colitis 02/28/2017   Coronary artery disease 02/28/2017   Overview:  history of coronary stents   Dehydration 02/28/2017   Depression 02/28/2017   Diabetes mellitus ([place]) 02/28/2017   GERD (gastroesophageal reflux disease) 02/28/2017   Hypercholesteremia 02/28/2017   Hypertension 02/28/2017   Hypotension 02/28/2017   [facility] 02/19/17   Incisional hernia, incarcerated 02/28/2017   Left bundle branch block 02/28/2017   Legally blind in right eye, as defined in [place] 02/28/2017   MI (myocardial infarction) ([place]) 02/28/2017   Osteoarthritis of spine 02/28/2017  PVD (peripheral vascular disease) ([place]) 09/28/2015   UTI (urinary tract infection) 02/28/2017   Ventral hernia 09/28/2015    Past Surgical History:  Procedure Laterality Date   ABDOMINAL AORTIC ANEURYSM REPAIR     open   ABDOMINAL HERNIA REPAIR     with dehiscence and chronic wound   ABDOMINAL HYSTERECTOMY     BYPASS GRAFT     CARDIAC  CATHETERIZATION      Current Medications: Current Meds  Medication Sig   aspirin EC 81 MG tablet Take 81 mg by mouth at bedtime.    atorvastatin (LIPITOR) 40 MG tablet Take 40 mg by mouth at bedtime.   calcium carbonate (CALCIUM 600) 600 MG TABS tablet Take 600 mg by mouth 2 (two) times daily with a meal.   citalopram (CELEXA) 20 MG tablet Take 20 mg by mouth daily.   clopidogrel (PLAVIX) 75 MG tablet Take 1 tablet (75 mg total) by mouth daily.   diltiazem (CARDIZEM CD) 120 MG 24 hr capsule Take 1 capsule (120 mg total) by mouth daily.   ezetimibe (ZETIA) 10 MG tablet Take 10 mg by mouth daily.   folic acid (FOLVITE) 1 MG tablet Take 1 mg by mouth daily.   metFORMIN (GLUCOPHAGE) 850 MG tablet Take 850 mg by mouth at bedtime.   nitroGLYCERIN (NITROSTAT) 0.4 MG SL tablet Place 1 tablet (0.4 mg total) under the tongue every 5 (five) minutes as needed for chest pain.   pantoprazole (PROTONIX) 40 MG tablet Take 40 mg by mouth daily.   Probiotic Product (PROBIOTIC DAILY PO) Take 1 capsule by mouth daily.     Allergies:   Tape   Social History   Socioeconomic History   Marital status: Married    Spouse name: Not on file   Number of children: Not on file   Years of education: Not on file   Highest education level: Not on file  Occupational History   Not on file  Social Needs   Financial resource strain: Not on file   Food insecurity    Worry: Not on file    Inability: Not on file   Transportation needs    Medical: Not on file    Non-medical: Not on file  Tobacco Use   Smoking status: Former Smoker    Types: Cigarettes    Quit date: 1999    Years since quitting: 21.9   Smokeless tobacco: Never Used  Substance and Sexual Activity   Alcohol use: No   Drug use: Yes    Types: Marijuana   Sexual activity: Not on file  Lifestyle   Physical activity    Days per week: Not on file    Minutes per session: Not on file   Stress: Not on file  Relationships    Social connections    Talks on phone: Not on file    Gets together: Not on file    Attends religious service: Not on file    Active member of club or organization: Not on file    Attends meetings of clubs or organizations: Not on file    Relationship status: Not on file  Other Topics Concern   Not on file  Social History Narrative   Not on file     Family History: The patient's family history includes Congestive Heart Failure in her mother and sister; Diabetes in her brother, mother, and sister; Hypertension in her mother and sister; Stroke in her mother. ROS:   Please see the history of present  illness.    All other systems reviewed and are negative.  EKGs/Labs/Other Studies Reviewed:    The following studies were reviewed today:  EKG:  EKG ordered today and personally reviewed.  The ekg ordered today demonstrates sinus rhythm left bundle branch block without repolarization changes  Recent Labs: 03/07/2019 cholesterol 147 LDL 57 HDL 51 A1c 6.2% creatinine 0.72  Physical Exam:    VS:  BP (!) 160/68 (BP Location: Right Arm, Patient Position: Sitting, Cuff Size: Normal)    Pulse 65    Ht 5' (1.524 m)    Wt 140 lb 6.4 oz (63.7 kg)    SpO2 97%    BMI 27.42 kg/m    Repeat blood blood pressure by me 146/66 Wt Readings from Last 3 Encounters:  05/07/19 140 lb 6.4 oz (63.7 kg)  01/08/18 136 lb (61.7 kg)  06/22/17 137 lb (62.1 kg)     GEN:  Well nourished, well developed in no acute distress HEENT: Normal NECK: No JVD; No carotid bruits LYMPHATICS: No lymphadenopathy CARDIAC: S2 is paradoxical RRR, no murmurs, rubs, gallops RESPIRATORY:  Clear to auscultation without rales, wheezing or rhonchi  ABDOMEN: Soft, non-tender, non-distended MUSCULOSKELETAL:  No edema; No deformity  SKIN: Warm and dry NEUROLOGIC:  Alert and oriented x 3 PSYCHIATRIC:  Normal affect    Signed, Marisa More, MD  05/07/2019 9:06 AM    [place]

## 2019-05-07 ENCOUNTER — Encounter: Payer: Self-pay | Admitting: Cardiology

## 2019-05-07 ENCOUNTER — Ambulatory Visit (INDEPENDENT_AMBULATORY_CARE_PROVIDER_SITE_OTHER): Payer: Medicare Other | Admitting: Cardiology

## 2019-05-07 ENCOUNTER — Other Ambulatory Visit: Payer: Self-pay

## 2019-05-07 VITALS — BP 160/68 | HR 65 | Ht 60.0 in | Wt 140.4 lb

## 2019-05-07 DIAGNOSIS — I447 Left bundle-branch block, unspecified: Secondary | ICD-10-CM | POA: Diagnosis not present

## 2019-05-07 DIAGNOSIS — I6523 Occlusion and stenosis of bilateral carotid arteries: Secondary | ICD-10-CM

## 2019-05-07 DIAGNOSIS — E7800 Pure hypercholesterolemia, unspecified: Secondary | ICD-10-CM

## 2019-05-07 DIAGNOSIS — I25118 Atherosclerotic heart disease of native coronary artery with other forms of angina pectoris: Secondary | ICD-10-CM

## 2019-05-07 DIAGNOSIS — I10 Essential (primary) hypertension: Secondary | ICD-10-CM | POA: Diagnosis not present

## 2019-05-07 MED ORDER — DILTIAZEM HCL ER COATED BEADS 120 MG PO CP24: 120.0000 mg | ORAL_CAPSULE | Freq: Every day | ORAL | 1 refills | Status: DC

## 2019-05-07 MED ORDER — NITROGLYCERIN 0.4 MG SL SUBL: 0.4000 mg | SUBLINGUAL_TABLET | SUBLINGUAL | 5 refills | Status: DC | PRN

## 2019-05-07 NOTE — Patient Instructions (Signed)
Medication Instructions:  Your physician recommends that you continue on your current medications as directed. Please refer to the Current Medication list given to you today.  *If you need a refill on your cardiac medications before your next appointment, please call your pharmacy*  Lab Work: None  If you have labs (blood work) drawn today and your tests are completely normal, you will receive your results only by: Marland Kitchen MyChart Message (if you have MyChart) OR . A paper copy in the mail If you have any lab test that is abnormal or we need to change your treatment, we will call you to review the results.  Testing/Procedures: You had an EKG today.   Follow-Up: At [facility], you and your health needs are our priority.  As part of our continuing mission to provide you with exceptional heart care, we have created designated Provider Care Teams.  These Care Teams include your primary Cardiologist (physician) and Advanced Practice Providers (APPs -  Physician Assistants and Nurse Practitioners) who all work together to provide you with the care you need, when you need it.  Your next appointment:   6 month(s)  The format for your next appointment:   In Person  Provider:   Shirlee More, MD  Other Instructions **Check your blood pressure a few times weekly and record these readings. Please bring your BP log with you to your next appointment for Dr. Bettina Gavia to review.

## 2019-05-21 DIAGNOSIS — H2512 Age-related nuclear cataract, left eye: Secondary | ICD-10-CM | POA: Diagnosis not present

## 2019-06-01 ENCOUNTER — Other Ambulatory Visit: Payer: Self-pay | Admitting: Cardiology

## 2019-07-22 ENCOUNTER — Other Ambulatory Visit: Payer: Self-pay | Admitting: *Deleted

## 2019-07-22 ENCOUNTER — Telehealth: Payer: Self-pay | Admitting: Cardiology

## 2019-07-22 MED ORDER — NITROGLYCERIN 0.4 MG SL SUBL: 0.4000 mg | SUBLINGUAL_TABLET | SUBLINGUAL | 5 refills | Status: DC | PRN

## 2019-07-22 MED ORDER — NITROGLYCERIN 0.4 MG SL SUBL: 0.4000 mg | SUBLINGUAL_TABLET | SUBLINGUAL | 1 refills | Status: DC | PRN

## 2019-07-22 MED ORDER — DILTIAZEM HCL ER COATED BEADS 120 MG PO CP24: 120.0000 mg | ORAL_CAPSULE | Freq: Every day | ORAL | 1 refills | Status: DC

## 2019-07-22 NOTE — Telephone Encounter (Signed)
New Message    *STAT* If patient is at the pharmacy, call can be transferred to refill team.   1. Which medications need to be refilled? (please list name of each medication and dose if known) diltiazem (CARDIZEM CD) 120 MG 24 hr capsule and nitroGLYCERIN (NITROSTAT) 0.4 MG SL tablet    2. Which pharmacy/location (including street and city if local pharmacy) is medication to be sent to? [facility] mail order   3. Do they need a 30 day or 90 day supply? [place]

## 2019-07-22 NOTE — Telephone Encounter (Signed)
Refill sent.

## 2019-08-29 ENCOUNTER — Other Ambulatory Visit: Payer: Self-pay | Admitting: Cardiology

## 2019-08-29 MED ORDER — CLOPIDOGREL BISULFATE 75 MG PO TABS: 75.0000 mg | ORAL_TABLET | Freq: Every day | ORAL | 2 refills | Status: DC

## 2019-08-29 NOTE — Telephone Encounter (Signed)
*  STAT* If patient is at the pharmacy, call can be transferred to refill team.   1. Which medications need to be refilled? (please list name of each medication and dose if known)  clopidogrel (PLAVIX) 75 MG tablet  2. Which pharmacy/location (including street and city if local pharmacy) is medication to be sent to? [facility] mail order   3. Do they need a 30 day or 90 day supply? 90 day supply    Patient has less than a week supply of medication left.

## 2019-11-13 NOTE — Progress Notes (Signed)
Cardiology Office Note:    Date:  11/14/2019   ID:  Marisa Callahan, DOB 04-30-1951, MRN [id number]  PCP:  Cher Nakai, MD  Cardiologist:  Shirlee More, MD    Referring MD: Cher Nakai, MD    ASSESSMENT:    1. Coronary artery disease of native artery of native heart with stable angina pectoris ([place])   2. Essential hypertension   3. Hypercholesteremia   4. Left bundle branch block   5. Bilateral carotid artery stenosis    PLAN:    In order of problems listed above:  1. Stable CAD New York Heart Association class I on current medical therapy continue her current dual antiplatelet rate limiting calcium channel blocker and combined statin and Zetia.  At this time I would not refer to coronary angiography 2. Blood pressure target continue treatment including ACE inhibitor 3. Lipids at target ideal continue combined therapy 4. Stable on previous EKG 5. Continue dual antiplatelet therapy combined statin and Zetia and consider repeat duplex at next visit   Next appointment: 1 year   Medication Adjustments/Labs and Tests Ordered: Current medicines are reviewed at length with the patient today.  Concerns regarding medicines are outlined above.  No orders of the defined types were placed in this encounter.  No orders of the defined types were placed in this encounter.   Chief Complaint  Patient presents with  . Follow-up    6 MO FU     History of Present Illness:    Marisa Callahan is a 69 y.o. female with a hx of CAD with PCI 1999 Type 2 DM hypertension hyperlipidemia carotid stenosis and open AAA surgery and aorto- bifemoral bypass in 2011 seen 06/22/17 after an episode of ischemic colitis with GI bleed. Her MPI 06/13/16 showed EF 41% and 1 vessel ischemia-LAD.   Although vascular surgery [facility] with most recent imaging July 2020 showing bilateral carotid stenosis 21 to 39% stenosis and stable bilateral ABIs are normal and imaging of the aortobifemoral graft showed  widely patent graft and no increased velocities. She was last seen 07/11/2019 Compliance with diet, lifestyle and medications: Yes  Recent labs reviewed with the patient 09/09/2019 CMP was normal A1c 5.9% at target lipids were good with a total cholesterol 115 triglycerides 182 HDL 39 LDL 46.  Home blood pressures reviewed with the patient are consistently at target.  About every 6 months she has vague symptoms not typical angina takes a nitroglycerin is relieved.  She is a vigorous active woman garden swims and has had no exertional chest pain shortness of breath palpitation or syncope.  She tolerates her dual antiplatelet therapy without GI symptoms or bleeding and lipid-lowering treatment with a high intensity statin and Zetia without muscle pain or weakness. Past Medical History:  Diagnosis Date  . Abdominal pain 02/28/2017  . Anxiety 02/28/2017  . Arthritis 02/28/2017  . Atherosclerosis of native artery of both lower extremities ([place]) 09/28/2015  . Bilateral carotid artery stenosis 09/28/2015  . Boil of buttock 02/28/2017   Overview:  right  . Cancer ([place]) 02/28/2017  . Colitis 02/28/2017  . Coronary artery disease 02/28/2017   Overview:  history of coronary stents  . Dehydration 02/28/2017  . Depression 02/28/2017  . Diabetes mellitus ([place]) 02/28/2017  . GERD (gastroesophageal reflux disease) 02/28/2017  . Hypercholesteremia 02/28/2017  . Hypertension 02/28/2017  . Hypotension 02/28/2017   [facility] 02/19/17  . Incisional hernia, incarcerated 02/28/2017  . Left bundle branch block 02/28/2017  . Legally  blind in right eye, as defined in [place] 02/28/2017  . MI (myocardial infarction) ([place]) 02/28/2017  . Osteoarthritis of spine 02/28/2017  . PVD (peripheral vascular disease) ([place]) 09/28/2015  . UTI (urinary tract infection) 02/28/2017  . Ventral hernia 09/28/2015    Past Surgical History:  Procedure Laterality Date  . ABDOMINAL AORTIC ANEURYSM REPAIR     open  . ABDOMINAL HERNIA REPAIR      with dehiscence and chronic wound  . ABDOMINAL HYSTERECTOMY    . BYPASS GRAFT    . CARDIAC CATHETERIZATION      Current Medications: Current Meds  Medication Sig  . ACCU-CHEK GUIDE test strip   . Accu-Chek Softclix Lancets lancets   . aspirin EC 81 MG tablet Take 81 mg by mouth at bedtime.   Marland Kitchen atorvastatin (LIPITOR) 40 MG tablet Take 40 mg by mouth at bedtime.  . Blood Glucose Calibration (ACCU-CHEK GUIDE CONTROL) LIQD   . Blood Glucose Monitoring Suppl (ACCU-CHEK GUIDE) w/Device KIT   . calcium carbonate (CALCIUM 600) 600 MG TABS tablet Take 600 mg by mouth 2 (two) times daily with a meal.  . citalopram (CELEXA) 20 MG tablet Take 20 mg by mouth daily.  . clopidogrel (PLAVIX) 75 MG tablet Take 1 tablet (75 mg total) by mouth daily.  Marland Kitchen diltiazem (CARDIZEM CD) 120 MG 24 hr capsule Take 1 capsule (120 mg total) by mouth daily.  Marland Kitchen diltiazem (TIAZAC) 120 MG 24 hr capsule Take 1 capsule by mouth daily.  Marland Kitchen ezetimibe (ZETIA) 10 MG tablet Take 10 mg by mouth daily.  . folic acid (FOLVITE) 1 MG tablet Take 1 mg by mouth daily.  Marland Kitchen lisinopril (ZESTRIL) 30 MG tablet Take 1 tablet by mouth daily.  . metFORMIN (GLUCOPHAGE) 850 MG tablet Take 850 mg by mouth at bedtime.  . pantoprazole (PROTONIX) 40 MG tablet Take 40 mg by mouth daily.  . Probiotic Product (PROBIOTIC DAILY PO) Take 1 capsule by mouth daily.     Allergies:   Tape   Social History   Socioeconomic History  . Marital status: Married    Spouse name: Not on file  . Number of children: Not on file  . Years of education: Not on file  . Highest education level: Not on file  Occupational History  . Not on file  Tobacco Use  . Smoking status: Former Smoker    Types: Cigarettes    Quit date: 1999    Years since quitting: 22.4  . Smokeless tobacco: Never Used  Vaping Use  . Vaping Use: Never used  Substance and Sexual Activity  . Alcohol use: No  . Drug use: Yes    Types: Marijuana  . Sexual activity: Not on file  Other  Topics Concern  . Not on file  Social History Narrative  . Not on file   Social Determinants of Health   Financial Resource Strain:   . Difficulty of Paying Living Expenses:   Food Insecurity:   . Worried About Charity fundraiser in the Last Year:   . Arboriculturist in the Last Year:   Transportation Needs:   . Film/video editor (Medical):   Marland Kitchen Lack of Transportation (Non-Medical):   Physical Activity:   . Days of Exercise per Week:   . Minutes of Exercise per Session:   Stress:   . Feeling of Stress :   Social Connections:   . Frequency of Communication with Friends and Family:   . Frequency of Social Gatherings  with Friends and Family:   . Attends Religious Services:   . Active Member of Clubs or Organizations:   . Attends Archivist Meetings:   Marland Kitchen Marital Status:      Family History: The patient's family history includes Congestive Heart Failure in her mother and sister; Diabetes in her brother, mother, and sister; Hypertension in her mother and sister; Stroke in her mother. ROS:   Please see the history of present illness.    All other systems reviewed and are negative.  EKGs/Labs/Other Studies Reviewed:    The following studies were reviewed today:  EKG:  EKG from 05/07/2019 shows sinus rhythm left bundle branch block   Physical Exam:    VS:  BP (!) 130/58 (BP Location: Left Arm, Patient Position: Sitting, Cuff Size: Normal)   Pulse 84   Ht 5' (1.524 m)   Wt 138 lb 12.8 oz (63 kg)   SpO2 99%   BMI 27.11 kg/m     Wt Readings from Last 3 Encounters:  11/14/19 138 lb 12.8 oz (63 kg)  05/07/19 140 lb 6.4 oz (63.7 kg)  01/08/18 136 lb (61.7 kg)     GEN:  Well nourished, well developed in no acute distress HEENT: Normal NECK: No JVD; No carotid bruits LYMPHATICS: No lymphadenopathy CARDIAC: S2 is paradoxical RRR, no murmurs, rubs, gallops RESPIRATORY:  Clear to auscultation without rales, wheezing or rhonchi  ABDOMEN: Soft, non-tender,  non-distended MUSCULOSKELETAL:  No edema; No deformity  SKIN: Warm and dry NEUROLOGIC:  Alert and oriented x 3 PSYCHIATRIC:  Normal affect    Signed, Shirlee More, MD  11/14/2019 10:23 AM    [place]

## 2019-11-14 ENCOUNTER — Encounter: Payer: Self-pay | Admitting: Cardiology

## 2019-11-14 ENCOUNTER — Ambulatory Visit: Payer: Medicare PPO | Admitting: Cardiology

## 2019-11-14 ENCOUNTER — Other Ambulatory Visit: Payer: Self-pay

## 2019-11-14 VITALS — BP 130/58 | HR 84 | Ht 60.0 in | Wt 138.8 lb

## 2019-11-14 DIAGNOSIS — I10 Essential (primary) hypertension: Secondary | ICD-10-CM

## 2019-11-14 DIAGNOSIS — I447 Left bundle-branch block, unspecified: Secondary | ICD-10-CM

## 2019-11-14 DIAGNOSIS — I6523 Occlusion and stenosis of bilateral carotid arteries: Secondary | ICD-10-CM

## 2019-11-14 DIAGNOSIS — E7800 Pure hypercholesterolemia, unspecified: Secondary | ICD-10-CM

## 2019-11-14 DIAGNOSIS — I25118 Atherosclerotic heart disease of native coronary artery with other forms of angina pectoris: Secondary | ICD-10-CM | POA: Diagnosis not present

## 2019-11-14 NOTE — Patient Instructions (Signed)

## 2019-12-10 DIAGNOSIS — E663 Overweight: Secondary | ICD-10-CM | POA: Diagnosis not present

## 2019-12-10 DIAGNOSIS — F3341 Major depressive disorder, recurrent, in partial remission: Secondary | ICD-10-CM | POA: Diagnosis not present

## 2019-12-10 DIAGNOSIS — I10 Essential (primary) hypertension: Secondary | ICD-10-CM | POA: Diagnosis not present

## 2019-12-10 DIAGNOSIS — M8580 Other specified disorders of bone density and structure, unspecified site: Secondary | ICD-10-CM | POA: Diagnosis not present

## 2019-12-10 DIAGNOSIS — F419 Anxiety disorder, unspecified: Secondary | ICD-10-CM | POA: Diagnosis not present

## 2019-12-10 DIAGNOSIS — E1159 Type 2 diabetes mellitus with other circulatory complications: Secondary | ICD-10-CM | POA: Diagnosis not present

## 2019-12-10 DIAGNOSIS — M109 Gout, unspecified: Secondary | ICD-10-CM | POA: Diagnosis not present

## 2019-12-10 DIAGNOSIS — I2510 Atherosclerotic heart disease of native coronary artery without angina pectoris: Secondary | ICD-10-CM | POA: Diagnosis not present

## 2019-12-10 DIAGNOSIS — I739 Peripheral vascular disease, unspecified: Secondary | ICD-10-CM | POA: Diagnosis not present

## 2019-12-10 DIAGNOSIS — E7800 Pure hypercholesterolemia, unspecified: Secondary | ICD-10-CM | POA: Diagnosis not present

## 2019-12-23 DIAGNOSIS — H43811 Vitreous degeneration, right eye: Secondary | ICD-10-CM | POA: Diagnosis not present

## 2020-01-02 ENCOUNTER — Other Ambulatory Visit: Payer: Self-pay | Admitting: Cardiology

## 2020-01-03 DIAGNOSIS — I70203 Unspecified atherosclerosis of native arteries of extremities, bilateral legs: Secondary | ICD-10-CM | POA: Diagnosis not present

## 2020-01-03 DIAGNOSIS — I739 Peripheral vascular disease, unspecified: Secondary | ICD-10-CM | POA: Diagnosis not present

## 2020-01-03 DIAGNOSIS — Z95820 Peripheral vascular angioplasty status with implants and grafts: Secondary | ICD-10-CM | POA: Diagnosis not present

## 2020-01-07 DIAGNOSIS — Z1231 Encounter for screening mammogram for malignant neoplasm of breast: Secondary | ICD-10-CM | POA: Diagnosis not present

## 2020-01-24 DIAGNOSIS — Z9181 History of falling: Secondary | ICD-10-CM | POA: Diagnosis not present

## 2020-01-24 DIAGNOSIS — Z0000 Encounter for general adult medical examination without abnormal findings: Secondary | ICD-10-CM | POA: Diagnosis not present

## 2020-01-24 DIAGNOSIS — E785 Hyperlipidemia, unspecified: Secondary | ICD-10-CM | POA: Diagnosis not present

## 2020-01-24 DIAGNOSIS — Z1331 Encounter for screening for depression: Secondary | ICD-10-CM | POA: Diagnosis not present

## 2020-02-12 DIAGNOSIS — N959 Unspecified menopausal and perimenopausal disorder: Secondary | ICD-10-CM | POA: Diagnosis not present

## 2020-02-12 DIAGNOSIS — M85851 Other specified disorders of bone density and structure, right thigh: Secondary | ICD-10-CM | POA: Diagnosis not present

## 2020-02-12 DIAGNOSIS — Z780 Asymptomatic menopausal state: Secondary | ICD-10-CM | POA: Diagnosis not present

## 2020-03-11 DIAGNOSIS — I2510 Atherosclerotic heart disease of native coronary artery without angina pectoris: Secondary | ICD-10-CM | POA: Diagnosis not present

## 2020-03-11 DIAGNOSIS — I10 Essential (primary) hypertension: Secondary | ICD-10-CM | POA: Diagnosis not present

## 2020-03-11 DIAGNOSIS — E7800 Pure hypercholesterolemia, unspecified: Secondary | ICD-10-CM | POA: Diagnosis not present

## 2020-03-11 DIAGNOSIS — I739 Peripheral vascular disease, unspecified: Secondary | ICD-10-CM | POA: Diagnosis not present

## 2020-03-11 DIAGNOSIS — M109 Gout, unspecified: Secondary | ICD-10-CM | POA: Diagnosis not present

## 2020-03-11 DIAGNOSIS — E1159 Type 2 diabetes mellitus with other circulatory complications: Secondary | ICD-10-CM | POA: Diagnosis not present

## 2020-03-11 DIAGNOSIS — M8580 Other specified disorders of bone density and structure, unspecified site: Secondary | ICD-10-CM | POA: Diagnosis not present

## 2020-03-11 DIAGNOSIS — F3341 Major depressive disorder, recurrent, in partial remission: Secondary | ICD-10-CM | POA: Diagnosis not present

## 2020-03-11 DIAGNOSIS — Z139 Encounter for screening, unspecified: Secondary | ICD-10-CM | POA: Diagnosis not present

## 2020-03-11 DIAGNOSIS — F419 Anxiety disorder, unspecified: Secondary | ICD-10-CM | POA: Diagnosis not present

## 2020-03-16 ENCOUNTER — Other Ambulatory Visit: Payer: Self-pay | Admitting: Cardiology

## 2020-03-16 MED ORDER — DILTIAZEM HCL ER COATED BEADS 120 MG PO CP24: 120.0000 mg | ORAL_CAPSULE | Freq: Every day | ORAL | 1 refills | Status: DC

## 2020-03-16 MED ORDER — CLOPIDOGREL BISULFATE 75 MG PO TABS: 75.0000 mg | ORAL_TABLET | Freq: Every day | ORAL | 1 refills | Status: DC

## 2020-03-16 NOTE — Telephone Encounter (Signed)
Medication filled.  

## 2020-03-16 NOTE — Telephone Encounter (Signed)
*  STAT* If patient is at the pharmacy, call can be transferred to refill team.   1. Which medications need to be refilled? (please list name of each medication and dose if known)  clopidogrel (PLAVIX) 75 MG tablet, diltiazem (CARDIZEM CD) 120 MG 24 hr capsule  2. Which pharmacy/location (including street and city if local pharmacy) is medication to be sent to? [place], [place]   3. Do they need a 30 day or 90 day supply? 90 for both

## 2020-03-24 DIAGNOSIS — M109 Gout, unspecified: Secondary | ICD-10-CM | POA: Diagnosis not present

## 2020-03-24 DIAGNOSIS — F419 Anxiety disorder, unspecified: Secondary | ICD-10-CM | POA: Diagnosis not present

## 2020-03-24 DIAGNOSIS — K219 Gastro-esophageal reflux disease without esophagitis: Secondary | ICD-10-CM | POA: Diagnosis not present

## 2020-03-24 DIAGNOSIS — F3341 Major depressive disorder, recurrent, in partial remission: Secondary | ICD-10-CM | POA: Diagnosis not present

## 2020-03-24 DIAGNOSIS — I739 Peripheral vascular disease, unspecified: Secondary | ICD-10-CM | POA: Diagnosis not present

## 2020-03-24 DIAGNOSIS — E1159 Type 2 diabetes mellitus with other circulatory complications: Secondary | ICD-10-CM | POA: Diagnosis not present

## 2020-03-24 DIAGNOSIS — Z0000 Encounter for general adult medical examination without abnormal findings: Secondary | ICD-10-CM | POA: Diagnosis not present

## 2020-03-24 DIAGNOSIS — I2510 Atherosclerotic heart disease of native coronary artery without angina pectoris: Secondary | ICD-10-CM | POA: Diagnosis not present

## 2020-03-24 DIAGNOSIS — M8580 Other specified disorders of bone density and structure, unspecified site: Secondary | ICD-10-CM | POA: Diagnosis not present

## 2020-04-01 ENCOUNTER — Encounter: Payer: Self-pay | Admitting: Gastroenterology

## 2020-04-01 ENCOUNTER — Other Ambulatory Visit (INDEPENDENT_AMBULATORY_CARE_PROVIDER_SITE_OTHER): Payer: Medicare PPO

## 2020-04-01 ENCOUNTER — Ambulatory Visit: Payer: Medicare PPO | Admitting: Gastroenterology

## 2020-04-01 VITALS — BP 138/76 | HR 76 | Ht 60.0 in | Wt 137.0 lb

## 2020-04-01 DIAGNOSIS — Z8719 Personal history of other diseases of the digestive system: Secondary | ICD-10-CM | POA: Diagnosis not present

## 2020-04-01 DIAGNOSIS — R634 Abnormal weight loss: Secondary | ICD-10-CM | POA: Diagnosis not present

## 2020-04-01 DIAGNOSIS — R197 Diarrhea, unspecified: Secondary | ICD-10-CM

## 2020-04-01 LAB — COMPREHENSIVE METABOLIC PANEL
ALT: 7 U/L (ref 0–35)
AST: 9 U/L (ref 0–37)
Albumin: 4.2 g/dL (ref 3.5–5.2)
Alkaline Phosphatase: 70 U/L (ref 39–117)
BUN: 8 mg/dL (ref 6–23)
CO2: 26 mEq/L (ref 19–32)
Calcium: 9.7 mg/dL (ref 8.4–10.5)
Chloride: 104 mEq/L (ref 96–112)
Creatinine, Ser: 0.7 mg/dL (ref 0.40–1.20)
GFR: 89.3 mL/min (ref >60.00)
Glucose, Bld: 103 mg/dL — ABNORMAL HIGH (ref 70–99)
Potassium: 4.6 mEq/L (ref 3.5–5.1)
Sodium: 139 mEq/L (ref 135–145)
Total Bilirubin: 0.3 mg/dL (ref 0.2–1.2)
Total Protein: 6.6 g/dL (ref 6.0–8.3)

## 2020-04-01 LAB — CBC WITH DIFFERENTIAL/PLATELET
Basophils Absolute: 0 10*3/uL (ref 0.0–0.1)
Basophils Relative: 0.5 % (ref 0.0–3.0)
Eosinophils Absolute: 0.1 10*3/uL (ref 0.0–0.7)
Eosinophils Relative: 1 % (ref 0.0–5.0)
HCT: 31 % — ABNORMAL LOW (ref 36.0–46.0)
Hemoglobin: 9.7 g/dL — ABNORMAL LOW (ref 12.0–15.0)
Lymphocytes Relative: 29.3 % (ref 12.0–46.0)
Lymphs Abs: 2.4 10*3/uL (ref 0.7–4.0)
MCHC: 31.4 g/dL (ref 30.0–36.0)
MCV: 70.1 fl — ABNORMAL LOW (ref 78.0–100.0)
Monocytes Absolute: 0.6 10*3/uL (ref 0.1–1.0)
Monocytes Relative: 7.2 % (ref 3.0–12.0)
Neutro Abs: 5.2 10*3/uL (ref 1.4–7.7)
Neutrophils Relative %: 62 % (ref 43.0–77.0)
Platelets: 345 10*3/uL (ref 150.0–400.0)
RBC: 4.42 Mil/uL (ref 3.87–5.11)
RDW: 18.4 % — ABNORMAL HIGH (ref 11.5–15.5)
WBC: 8.3 10*3/uL (ref 4.0–10.5)

## 2020-04-01 LAB — C-REACTIVE PROTEIN: CRP: <1 mg/dL (ref 0.5–20.0)

## 2020-04-01 LAB — TSH: TSH: 0.98 u[IU]/mL (ref 0.35–4.50)

## 2020-04-01 NOTE — Patient Instructions (Signed)
If you are age 67 or older, your body mass index should be between 23-30. Your Body mass index is 26.76 kg/m. If this is out of the aforementioned range listed, please consider follow up with your Primary Care Provider.  If you are age 24 or younger, your body mass index should be between 19-25. Your Body mass index is 26.76 kg/m. If this is out of the aformentioned range listed, please consider follow up with your Primary Care Provider.  Please go to the lab at [facility] ([place] You will need to go to level "B", you do not need an appointment for this. Hours available are 7:30 am - 4:30 pm.  You will need to have these labs drawn before the day you are scheduled for your C scan or your test will be canceled.   Please call Dr. Leland Her nurse Claiborne Billings, RN)  in 2 weeks at [phone]  to let her now how you are doing.   You have been scheduled for a CT scan of the abdomen and pelvis at [facility].   You are scheduled on 04/07/20 at 11:30am. You should arrive 15 minutes prior to your appointment time for registration. Please follow the written instructions below on the day of your exam:   1) Do not eat or drink anything after 7:30am (4 hours prior to your test)  This test typically takes 30-45 minutes to complete.  If you have any questions regarding your exam or if you need to reschedule, you may call the CT department at [phone] between the hours of 8:00 am and 5:00 pm, Monday-Friday.  ________________________________________________________________________    Follow up in 12 weeks.   Change metformin to in the morning.   Thank you,  Dr. Jackquline Denmark

## 2020-04-01 NOTE — Progress Notes (Signed)
Chief Complaint: Diarrhea  Referring Provider:  Cher Nakai, MD      ASSESSMENT AND PLAN;   #1. Diarrhea with lower abdominal pain, bloating and wt loss. D/d includes IBS-D, infectious causes, postcholecystectomy diarrhea, diarrhea d/t medications like Metformin, microscopic colitis, IBD, malabsorption, celiac disease. Nl TSH.  Doubt recurrence of ischemic colitis due to absence of blood.   #2. H/O Ischemic colitis on colon 02/2017.  Had LGI bleed requiring 2 units PRBC.  CT Abdo/pelvis showed sigmoid wall thickening.  Managed conservatively.  Plan: -Stool studies for GI Pathogen (includes C. Diff), giardia antigen, fecal elastase, fat and Calprotectin. -CTA Abdo/pelvis. -CBC, CMP, CRP, TSH, celiac  -If still with problems, my need rpt colon.  Otherwise, conservative management. -Stop probiotics -Change metformin to AM. -FU in 12 weeks. -Call in 2 weeks    HPI:    Marisa Callahan is a 69 y.o. female  AAA repair with aortofemoral bypass B/L 11/2009, then incisonal hernis repair x 3, DM, PVD, PAD, anxiety/depression, HTN, HLD, CAD s/p MI with complaints of diarrhea.  C/O diarrhea with nocturnal symptoms, with bloating and lower abo pain 4-5 BM/day. 1st BM in AM is hard to move. Then, mushy. No blood. Has not used any imodium.  Wt loss 5-7lb  Appetite is good.  Rare N/V/hearburn.  No fever or chills.   Past Medical History:  Diagnosis Date   Abdominal pain 02/28/2017   Anxiety 02/28/2017   Arthritis 02/28/2017   Atherosclerosis of native artery of both lower extremities ([place]) 09/28/2015   Bilateral carotid artery stenosis 09/28/2015   Boil of buttock 02/28/2017   Overview:  right   Cancer ([place]) 02/28/2017   Colitis 02/28/2017   Colon polyp    Coronary artery disease 02/28/2017   Overview:  history of coronary stents   Dehydration 02/28/2017   Depression 02/28/2017   Diabetes mellitus ([place]) 02/28/2017   GERD (gastroesophageal reflux disease) 02/28/2017   GI  bleed    History of AAA (abdominal aortic aneurysm) repair 12/03/2009   [place]   Hypercholesteremia 02/28/2017   Hypertension 02/28/2017   Hypotension 02/28/2017   [facility] 02/19/17   IDA (iron deficiency anemia)    Incisional hernia, incarcerated 02/28/2017   Left bundle branch block 02/28/2017   Legally blind in right eye, as defined in [place] 02/28/2017   MI (myocardial infarction) ([place]) 02/28/2017   Osteoarthritis of spine 02/28/2017   PVD (peripheral vascular disease) ([place]) 09/28/2015   Sinus tachycardia    UTI (urinary tract infection) 02/28/2017   Ventral hernia 09/28/2015    Past Surgical History:  Procedure Laterality Date   ABDOMINAL AORTIC ANEURYSM REPAIR     open   ABDOMINAL HERNIA REPAIR     with dehiscence and chronic wound   ABDOMINAL HYSTERECTOMY     BACK SURGERY     as a child   BYPASS GRAFT     [place]     x2   CHOLECYSTECTOMY     COLONOSCOPY  02/12/2017   Ischemic colitis (involving distal transvere colon, descending colon and proximal sigmoid colon) Moderate sigmoid diverticulosis   HAND SURGERY Bilateral    repair   HERNIA REPAIR  2017   TRIGGER FINGER RELEASE     x2    Family History  Problem Relation Age of Onset   Hypertension Mother    Diabetes Mother    Stroke Mother    Congestive Heart Failure Mother  Hypertension Sister    Diabetes Sister    Congestive Heart Failure Sister    Diabetes Brother     Social History   Tobacco Use   Smoking status: Former Smoker    Types: Cigarettes    Quit date: 1999    Years since quitting: 22.8   Smokeless tobacco: Never Used  Scientific laboratory technician Use: Never used  Substance Use Topics   Alcohol use: No   Drug use: Yes    Types: Marijuana    Current Outpatient Medications  Medication Sig Dispense Refill   ACCU-CHEK GUIDE test strip      Accu-Chek Softclix Lancets lancets      aspirin  EC 81 MG tablet Take 81 mg by mouth at bedtime.      atorvastatin (LIPITOR) 40 MG tablet Take 40 mg by mouth at bedtime.     Blood Glucose Calibration (ACCU-CHEK GUIDE CONTROL) LIQD      Blood Glucose Monitoring Suppl (ACCU-CHEK GUIDE) w/Device KIT      calcium carbonate (CALCIUM 600) 600 MG TABS tablet Take 600 mg by mouth 2 (two) times daily with a meal.     citalopram (CELEXA) 20 MG tablet Take 20 mg by mouth daily.     clopidogrel (PLAVIX) 75 MG tablet Take 1 tablet (75 mg total) by mouth daily. 90 tablet 1   diltiazem (CARDIZEM CD) 120 MG 24 hr capsule Take 1 capsule (120 mg total) by mouth daily. 90 capsule 1   ezetimibe (ZETIA) 10 MG tablet Take 10 mg by mouth daily.     folic acid (FOLVITE) 1 MG tablet Take 1 mg by mouth daily.     lisinopril (ZESTRIL) 30 MG tablet Take 1 tablet by mouth daily.     metFORMIN (GLUCOPHAGE) 850 MG tablet Take 850 mg by mouth at bedtime.     pantoprazole (PROTONIX) 40 MG tablet Take 40 mg by mouth 2 (two) times daily.      Probiotic Product (PROBIOTIC DAILY PO) Take 1 capsule by mouth daily.     nitroGLYCERIN (NITROSTAT) 0.4 MG SL tablet Place 1 tablet (0.4 mg total) under the tongue every 5 (five) minutes as needed for chest pain. (Patient not taking: Reported on 04/01/2020) 90 tablet 1   No current facility-administered medications for this visit.    Allergies  Allergen Reactions   Tape Other (See Comments)    Blisters Cordran surgical tape per MD H&P    Review of Systems:  Constitutional: Denies fever, chills, diaphoresis, appetite change and fatigue.  HEENT: Denies photophobia, eye pain, redness, hearing loss, ear pain, congestion, sore throat, rhinorrhea, sneezing, mouth sores, neck pain, neck stiffness and tinnitus.   Respiratory: Denies SOB, DOE, cough, chest tightness,  and wheezing.   Cardiovascular: Denies chest pain, palpitations and leg swelling.  Genitourinary: Denies dysuria, urgency, frequency, hematuria, flank pain  and difficulty urinating.  Musculoskeletal: Denies myalgias, back pain, joint swelling, arthralgias and gait problem.  Skin: No rash.  Neurological: Denies dizziness, seizures, syncope, weakness, light-headedness, numbness and headaches.  Hematological: Denies adenopathy. Easy bruising, personal or family bleeding history  Psychiatric/Behavioral: No anxiety or depression     Physical Exam:    BP 138/76    Pulse 76    Ht 5' (1.524 m)    Wt 137 lb (62.1 kg)    BMI 26.76 kg/m  Wt Readings from Last 3 Encounters:  04/01/20 137 lb (62.1 kg)  11/14/19 138 lb 12.8 oz (63 kg)  05/07/19 140 lb 6.4  oz (63.7 kg)   Constitutional:  Well-developed, in no acute distress. Psychiatric: Normal mood and affect. Behavior is normal. HEENT: Pupils normal.  Conjunctivae are normal. No scleral icterus. Neck supple.  Cardiovascular: Normal rate, regular rhythm. No edema Pulmonary/chest: Effort normal and breath sounds normal. No wheezing, rales or rhonchi. Abdominal: Soft, nondistended. Nontender. Bowel sounds active throughout. There are no masses palpable. No hepatomegaly.  Well-healed surgical scars. Rectal:  defered Neurological: Alert and oriented to person place and time. Skin: Skin is warm and dry. No rashes noted.  Data Reviewed: I have personally reviewed following labs and imaging studies     Carmell Austria, MD 04/01/2020, 9:42 AM  Cc: Cher Nakai, MD

## 2020-04-01 NOTE — Progress Notes (Signed)
Please inform the patient. Hb 9.7, MCV 70. Nl CMP, CRP. S/O new onset IDA Proceed with CTA ASAP Add iron studies to blood drawn today. Also pl set her for EGD/colon in 3-4 weeks with miralax. Send report to family physician

## 2020-04-02 ENCOUNTER — Other Ambulatory Visit: Payer: Medicare PPO

## 2020-04-02 DIAGNOSIS — Z8719 Personal history of other diseases of the digestive system: Secondary | ICD-10-CM | POA: Diagnosis not present

## 2020-04-02 DIAGNOSIS — R634 Abnormal weight loss: Secondary | ICD-10-CM

## 2020-04-02 DIAGNOSIS — R197 Diarrhea, unspecified: Secondary | ICD-10-CM

## 2020-04-02 NOTE — Addendum Note (Signed)
Addended by: Karena Addison on: 04/02/2020 10:34 AM   Modules accepted: Orders

## 2020-04-03 ENCOUNTER — Other Ambulatory Visit: Payer: Self-pay | Admitting: Gastroenterology

## 2020-04-03 ENCOUNTER — Telehealth: Payer: Self-pay

## 2020-04-03 ENCOUNTER — Telehealth: Payer: Self-pay | Admitting: General Surgery

## 2020-04-03 ENCOUNTER — Telehealth: Payer: Self-pay | Admitting: Gastroenterology

## 2020-04-03 DIAGNOSIS — D509 Iron deficiency anemia, unspecified: Secondary | ICD-10-CM

## 2020-04-03 LAB — CELIAC PANEL 10
Antigliadin Abs, IgA: 3 units (ref 0–19)
Endomysial IgA: NEGATIVE
Gliadin IgG: 1 units (ref 0–19)
IgA/Immunoglobulin A, Serum: 152 mg/dL (ref 87–352)
Tissue Transglut Ab: <2 U/mL (ref 0–5)
Transglutaminase IgA: <2 U/mL (ref 0–3)

## 2020-04-03 NOTE — Telephone Encounter (Signed)
Spoke to patient to inform her that she can hold her Plavix 5 days prior to her procedure. Patient voiced understanding.

## 2020-04-03 NOTE — Telephone Encounter (Signed)
   Primary Cardiologist: Shirlee More, MD  Chart reviewed as part of pre-operative protocol coverage. Per Dr. Harriet Masson, patient can hold plavix 5 days prior to her upcoming EGD/colonoscopy with plans to restart as soon as she is cleared to do so by her gastroenterologist.   I will route this recommendation to the requesting party via [facility] fax function and remove from pre-op pool.  Please call with questions.  Abigail Butts, PA-C 04/03/2020, 2:00 PM

## 2020-04-03 NOTE — Telephone Encounter (Signed)
[facility] Pre-operative Risk Assessment     Request for surgical clearance:     Endoscopy/Colonoscopy Procedure  What type of surgery is being performed?     EGD/Colonoscopy  When is this surgery scheduled?     05/05/20  What type of clearance is required ?   Pharmacy  Are there any medications that need to be held prior to surgery and how long? Plavix  Practice name and name of physician performing surgery?      [facility]  What is your office phone and fax number?      Phone- [phone]  [phone]  Anesthesia type (None, local, MAC, general) ?       MAC

## 2020-04-03 NOTE — Telephone Encounter (Signed)
It will be ok to hold her plavix 5 days.

## 2020-04-03 NOTE — Telephone Encounter (Signed)
[facility] Pre-operative Risk Assessment     Request for surgical clearance:     Endoscopy Procedure  What type of surgery is being performed?     EGD/Colonoscopy  When is this surgery scheduled?     05/05/20  What type of clearance is required ?   Pharmacy  Are there any medications that need to be held prior to surgery and how long? Plavix  Practice name and name of physician performing surgery?      [facility]  What is your office phone and fax number?      Phone- [phone]  [phone]  Anesthesia type (None, local, MAC, general) ?       MAC

## 2020-04-06 ENCOUNTER — Telehealth: Payer: Self-pay | Admitting: Gastroenterology

## 2020-04-06 ENCOUNTER — Other Ambulatory Visit: Payer: Self-pay | Admitting: Gastroenterology

## 2020-04-06 ENCOUNTER — Encounter: Payer: Self-pay | Admitting: Gastroenterology

## 2020-04-06 LAB — GI PROFILE, STOOL, PCR

## 2020-04-06 MED ORDER — VANCOMYCIN HCL 125 MG PO CAPS: 125.0000 mg | ORAL_CAPSULE | Freq: Four times a day (QID) | ORAL | 0 refills | Status: AC

## 2020-04-06 NOTE — Telephone Encounter (Signed)
Sent message to Dr Lyndel Safe

## 2020-04-06 NOTE — Telephone Encounter (Signed)
Marisa Callahan with [employer] called again about this message. She would like to give report on pt's labs. Pls call her at [phone], option 1.

## 2020-04-06 NOTE — Telephone Encounter (Signed)
Orders have been signed.

## 2020-04-07 ENCOUNTER — Other Ambulatory Visit (INDEPENDENT_AMBULATORY_CARE_PROVIDER_SITE_OTHER): Payer: Medicare PPO

## 2020-04-07 ENCOUNTER — Ambulatory Visit (HOSPITAL_COMMUNITY)
Admission: RE | Admit: 2020-04-07 | Discharge: 2020-04-07 | Disposition: A | Discharge status: Home / Self Care | Payer: Medicare PPO | Source: Ambulatory Visit | Attending: Gastroenterology | Admitting: Gastroenterology

## 2020-04-07 ENCOUNTER — Encounter (HOSPITAL_COMMUNITY): Payer: Self-pay

## 2020-04-07 ENCOUNTER — Other Ambulatory Visit: Payer: Self-pay

## 2020-04-07 DIAGNOSIS — R634 Abnormal weight loss: Secondary | ICD-10-CM | POA: Diagnosis not present

## 2020-04-07 DIAGNOSIS — K529 Noninfective gastroenteritis and colitis, unspecified: Secondary | ICD-10-CM | POA: Diagnosis not present

## 2020-04-07 DIAGNOSIS — K559 Vascular disorder of intestine, unspecified: Secondary | ICD-10-CM | POA: Diagnosis not present

## 2020-04-07 DIAGNOSIS — Z8719 Personal history of other diseases of the digestive system: Secondary | ICD-10-CM | POA: Diagnosis not present

## 2020-04-07 DIAGNOSIS — D509 Iron deficiency anemia, unspecified: Secondary | ICD-10-CM

## 2020-04-07 DIAGNOSIS — K6389 Other specified diseases of intestine: Secondary | ICD-10-CM | POA: Diagnosis not present

## 2020-04-07 DIAGNOSIS — R197 Diarrhea, unspecified: Secondary | ICD-10-CM | POA: Insufficient documentation

## 2020-04-07 LAB — IBC + FERRITIN
Ferritin: 5.2 ng/mL — ABNORMAL LOW (ref 10.0–291.0)
Iron: 18 ug/dL — ABNORMAL LOW (ref 42–145)
Saturation Ratios: 3.7 % — ABNORMAL LOW (ref 20.0–50.0)
Transferrin: 345 mg/dL (ref 212.0–360.0)

## 2020-04-07 MED ORDER — IOHEXOL 350 MG/ML SOLN
100.0000 mL | Freq: Once | INTRAVENOUS | Status: AC | PRN
  Administered 2020-04-07: 100 mL via INTRAVENOUS

## 2020-04-10 ENCOUNTER — Telehealth: Payer: Self-pay | Admitting: Gastroenterology

## 2020-04-10 NOTE — Telephone Encounter (Signed)
Pls call pt. She would like to speak with you about recent labs that she had done. Pls call pt at her house phone.

## 2020-04-10 NOTE — Telephone Encounter (Signed)
Spoke to patient. We discussed her recent lab results. All questions answered. Patient voiced understanding.

## 2020-04-12 LAB — GIARDIA ANTIGEN
MICRO NUMBER:: 11131336
RESULT:: NOT DETECTED
SPECIMEN QUALITY:: ADEQUATE

## 2020-04-12 LAB — FECAL FAT, QUALITATIVE: FECAL FAT, QUALITATIVE: NORMAL

## 2020-04-12 LAB — CALPROTECTIN: Calprotectin: 743 mcg/g — ABNORMAL HIGH

## 2020-04-12 LAB — PANCREATIC ELASTASE, FECAL: Pancreatic Elastase-1, Stool: >500 mcg/g

## 2020-04-12 NOTE — Progress Notes (Signed)
Please inform the patient. CTA-with significant mesenteric artery disease, suggestive of chronic mesenteric ischemia.  Hemodynamically significant celiac stenosis, SMA stenosis and IMA stenosis. Plan: -Please set Khia up for IR consultation to determine if mesenteric angioplasty is an option and can be performed safely. -Continue rest of medications for now. Send report to family physician

## 2020-04-13 ENCOUNTER — Telehealth: Payer: Self-pay | Admitting: Gastroenterology

## 2020-04-13 ENCOUNTER — Other Ambulatory Visit: Payer: Self-pay | Admitting: Gastroenterology

## 2020-04-13 DIAGNOSIS — D509 Iron deficiency anemia, unspecified: Secondary | ICD-10-CM

## 2020-04-13 NOTE — Telephone Encounter (Signed)
Spoke with patient to inform her of resent results. She will be notified with a date and tin with her iron infusions. A message has been sent ti IR to schedule referral for possible mesenteric angioplasty. Patient voiced understanding.

## 2020-04-14 ENCOUNTER — Other Ambulatory Visit: Payer: Self-pay | Admitting: Gastroenterology

## 2020-04-14 DIAGNOSIS — K551 Chronic vascular disorders of intestine: Secondary | ICD-10-CM

## 2020-04-15 ENCOUNTER — Ambulatory Visit
Admission: RE | Admit: 2020-04-15 | Discharge: 2020-04-15 | Disposition: A | Discharge status: Home / Self Care | Payer: Medicare PPO | Source: Ambulatory Visit | Attending: Gastroenterology | Admitting: Gastroenterology

## 2020-04-15 ENCOUNTER — Other Ambulatory Visit: Payer: Self-pay

## 2020-04-15 ENCOUNTER — Ambulatory Visit (AMBULATORY_SURGERY_CENTER): Payer: Self-pay | Admitting: *Deleted

## 2020-04-15 ENCOUNTER — Other Ambulatory Visit (HOSPITAL_COMMUNITY): Payer: Self-pay | Admitting: Interventional Radiology

## 2020-04-15 VITALS — Ht 60.0 in | Wt 132.0 lb

## 2020-04-15 DIAGNOSIS — K551 Chronic vascular disorders of intestine: Secondary | ICD-10-CM

## 2020-04-15 DIAGNOSIS — Z8719 Personal history of other diseases of the digestive system: Secondary | ICD-10-CM

## 2020-04-15 DIAGNOSIS — K55059 Acute (reversible) ischemia of intestine, part and extent unspecified: Secondary | ICD-10-CM | POA: Diagnosis not present

## 2020-04-15 DIAGNOSIS — D509 Iron deficiency anemia, unspecified: Secondary | ICD-10-CM

## 2020-04-15 HISTORY — PX: IR RADIOLOGIST EVAL & MGMT: IMG5224

## 2020-04-15 NOTE — Telephone Encounter (Signed)
Spoke to patient to inform her that the referral has been placed for the iron infusion. She should be getting a call by the end of this week or early next week. Patient voiced understanding. She has an appointment with IR in December to discuss possible mesenteric angioplasty.

## 2020-04-15 NOTE — Consult Note (Signed)
Chief Complaint: Patient was consulted remotely today (TeleHealth) for recurrent ischemic colitis at the request of [employer].    Referring Physician(s): Gupta,Rajesh  History of Present Illness: Sherrel Ploch is a 69 y.o. 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 Her first episode of colitis was in January 2019 associated with GI bleeding. She said abdominal pain the past 6 to 8 weeks with 15 pounds unanticipated weight loss.  This is not described as postprandial.  She was diagnosed with C. difficile.  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  There is IMA ligation at the time of her previous vascular surgery.  Past Medical History:  Diagnosis Date  . Abdominal pain 02/28/2017  . Anxiety 02/28/2017  . Arthritis 02/28/2017  . Atherosclerosis of native artery of both lower extremities ([place]) 09/28/2015  . Bilateral carotid artery stenosis 09/28/2015  . Boil of buttock 02/28/2017   Overview:  right  . Cancer ([place]) 1977   cervix   . Colitis 02/28/2017  . Colon polyp   . Coronary artery disease 02/28/2017   Overview:  history of coronary stents  . Dehydration 02/28/2017  . Depression 02/28/2017  . Diabetes mellitus ([place]) 02/28/2017  . GERD (gastroesophageal reflux disease) 02/28/2017  . GI bleed   . History of AAA (abdominal aortic aneurysm) repair 12/03/2009   [place]  . Hypercholesteremia 02/28/2017  . Hypertension 02/28/2017  . Hypotension 02/28/2017   [facility] 02/19/17  . IDA (iron deficiency anemia)   . Incisional hernia, incarcerated 02/28/2017  . Left bundle branch block 02/28/2017  . 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 MI  (myocardial infarction) ([place]) 1999  . Osteoarthritis of spine 02/28/2017  . PVD (peripheral vascular disease) ([place]) 09/28/2015  . Sinus tachycardia   . Stroke ([place]) 02/14/2014   right eye stroke  . UTI (urinary tract infection) 02/28/2017  . Ventral hernia 09/28/2015    Past Surgical History:  Procedure Laterality Date  . ABDOMINAL AORTIC ANEURYSM REPAIR     open  . ABDOMINAL HERNIA REPAIR     with dehiscence and chronic wound  . ABDOMINAL HYSTERECTOMY    . BACK SURGERY     as a child  . BYPASS GRAFT    . CARDIAC CATHETERIZATION    . CESAREAN SECTION     x2  . CHOLECYSTECTOMY    . COLONOSCOPY  02/12/2017   Ischemic colitis (involving distal transvere colon, descending colon and proximal sigmoid colon) Moderate sigmoid diverticulosis  . HAND SURGERY Bilateral    repair  . HERNIA REPAIR  2017  . TRIGGER FINGER RELEASE     x2    Allergies: Tape  Medications: Prior to Admission medications   Medication Sig Start Date End Date Taking? Authorizing Provider  ACCU-CHEK GUIDE test strip  10/26/19   [provider]  Accu-Chek Softclix Lancets lancets  10/26/19   [provider]  aspirin EC 81 MG tablet Take 81 mg by mouth at bedtime.     [provider]  atorvastatin (LIPITOR) 40 MG tablet Take 40 mg by mouth at bedtime.    [provider]  Blood Glucose Calibration (ACCU-CHEK GUIDE CONTROL) LIQD  10/28/19   [provider]  Blood Glucose Monitoring Suppl (ACCU-CHEK GUIDE) w/Device KIT  10/28/19   [provider]  calcium carbonate (CALCIUM 600) 600 MG TABS tablet Take 600 mg by mouth 2 (two) times daily with a meal.    [provider]  citalopram (CELEXA) 20 MG tablet Take 20 mg by mouth daily.    [provider]  clopidogrel (PLAVIX) 75 MG tablet Take 1 tablet (75 mg total) by mouth daily. 03/16/20   Richardo Priest, MD  diltiazem (CARDIZEM CD) 120 MG 24 hr capsule Take 1 capsule (120 mg total) by mouth daily.  03/16/20   Richardo Priest, MD  ezetimibe (ZETIA) 10 MG tablet Take 10 mg by mouth daily.    [provider]  folic acid (FOLVITE) 1 MG tablet Take 1 mg by mouth daily.    [provider]  lisinopril (ZESTRIL) 30 MG tablet Take 1 tablet by mouth daily. 10/07/19   [provider]  metFORMIN (GLUCOPHAGE) 850 MG tablet Take 850 mg by mouth daily with breakfast.     [provider]  nitroGLYCERIN (NITROSTAT) 0.4 MG SL tablet Place 1 tablet (0.4 mg total) under the tongue every 5 (five) minutes as needed for chest pain. Patient not taking: Reported on 04/01/2020 07/22/19 10/20/19  Richardo Priest, MD  pantoprazole (PROTONIX) 40 MG tablet Take 40 mg by mouth 2 (two) times daily.     [provider]  vancomycin (VANCOCIN HCL) 125 MG capsule Take 1 capsule (125 mg total) by mouth 4 (four) times daily for 14 days. 04/06/20 04/20/20  Jackquline Denmark, MD     Family History  Problem Relation Age of Onset  . Hypertension Mother   . Diabetes Mother   . Stroke Mother   . Congestive Heart Failure Mother   . Hypertension Sister   . Diabetes Sister   . Congestive Heart Failure Sister   . Diabetes Brother   . Colon cancer Neg Hx   . Colon polyps Neg Hx   . Esophageal cancer Neg Hx   . Rectal cancer Neg Hx   . Stomach cancer Neg Hx     Social History   Socioeconomic History  . Marital status: Married    Spouse name: Not on file  . Number of children: Not on file  . Years of education: Not on file  . Highest education level: Not on file  Occupational History  . Not on file  Tobacco Use  . Smoking status: Former Smoker    Types: Cigarettes    Quit date: 1999    Years since quitting: 22.8  . Smokeless tobacco: Never Used  Vaping Use  . Vaping Use: Never used  Substance and Sexual Activity  . Alcohol use: No  . Drug use: Yes    Frequency: 7.0 times per week    Types: Marijuana  . Sexual activity: Not on file  Other Topics Concern  . Not on file    Social History Narrative  . Not on file   Social Determinants of Health   Financial Resource Strain:   . Difficulty of Paying Living Expenses: Not on file  Food Insecurity:   . Worried About Charity fundraiser in the Last Year: Not on file  . Ran Out of Food in the Last Year: Not on file  Transportation Needs:   . Lack of Transportation (Medical): Not on file  . Lack of Transportation (Non-Medical): Not on file  Physical Activity:   . Days of  Exercise per Week: Not on file  . Minutes of Exercise per Session: Not on file  Stress:   . Feeling of Stress : Not on file  Social Connections:   . Frequency of Communication with Friends and Family: Not on file  . Frequency of Social Gatherings with Friends and Family: Not on file  . Attends Religious Services: Not on file  . Active Member of Clubs or Organizations: Not on file  . Attends Archivist Meetings: Not on file  . Marital Status: Not on file    ECOG Status: 1 - Symptomatic but completely ambulatory  Review of Systems  Review of Systems: A 12 point ROS discussed and pertinent positives are indicated in the HPI above.  All other systems are negative.  Physical Exam No direct physical exam was performed (except for noted visual exam findings with Video Visits).     Vital Signs: There were no vitals taken for this visit.  Imaging: CT Angio Abd/Pel w/ and/or w/o  Result Date: 04/07/2020 CLINICAL DATA:  Diarrhea, weight loss and postprandial abdominal pain. History of ischemic colitis. EXAM: CT ANGIOGRAPHY ABDOMEN AND PELVIS WITH CONTRAST TECHNIQUE: Multidetector CT imaging of the abdomen and pelvis was performed using the standard protocol during bolus administration of intravenous contrast. Multiplanar reconstructed images and MIPs were obtained and reviewed to evaluate the vascular anatomy. CONTRAST:  14m OMNIPAQUE IOHEXOL 350 MG/ML SOLN COMPARISON:  CT of the abdomen and pelvis on 02/19/2017 and prior CTA of  the abdomen on 06/24/2008, [facility] FINDINGS: VASCULAR Aorta: Native aorta demonstrates stable atherosclerosis. The aortobifemoral graft demonstrates stable appearance and normal patency by CTA. No evidence of aortic dissection. Celiac: Calcified plaque causes moderate celiac origin stenosis approximately 50-70%. This may be approaching close to a hemodynamically significant stenosis. The celiac trunk did not demonstrate any evidence of stenosis in 2010. Distal branches are patent and normally opacified. SMA: Origin and proximal trunk of the superior mesenteric artery demonstrates severe disease which was likely present in 2018 but is better characterized on the current CTA. This has clearly progressed since the prior CTA in 2010. A combination of calcified and noncalcified plaque causes subtotal/total occlusive disease. Distal trunk patency is likely reliant mostly on collateral flow from visible pancreaticoduodenal branches. Renals: Mild proximal disease of single renal arteries bilaterally without significant renal artery stenosis. IMA: The IMA has been sacrificed at the time of aortic grafting. There is not very good collateralization visualized by CTA via an arc of [place] or enlarged marginal artery. Inflow: Patent aortobifemoral limbs bilaterally. Proximal Outflow: The femoral bifurcations are normally patent bilaterally. Veins: Venous phase imaging demonstrates normal patency of visualized mesenteric veins, splenic vein and portal vein. The IVC, bilateral renal veins and iliac veins are normally patent. Review of the MIP images confirms the above findings. NON-VASCULAR Lower chest: No acute abnormality.  Stable small hiatal hernia. Hepatobiliary: The liver demonstrates steatosis. Status post cholecystectomy. No biliary dilatation. Pancreas: Unremarkable. No pancreatic ductal dilatation or surrounding inflammatory changes. Spleen: Normal in size without focal abnormality. Adrenals/Urinary  Tract: Stable left renal cyst. No hydronephrosis or renal masses. Adrenal glands are normal. The bladder is unremarkable. Stomach/Bowel: Bowel again demonstrates chronic wall thickening the distal descending colon and juncture of the descending and sigmoid colon. Degree of wall thickening and inflammation is not as severe as the 2018 scan but may continue to reflect some degree chronic ischemic colitis. There is no associated bowel obstruction, free intraperitoneal air or abscess. Lymphatic: No enlarged abdominal  or pelvic lymph nodes. Reproductive: Status post hysterectomy. No adnexal masses. Other: No abdominal wall hernia or abnormality. No abdominopelvic ascites. Musculoskeletal: No acute findings. Progression of degenerative disc disease at L2-3 since 2018. IMPRESSION: 1. Stable appearance and normal patency of aortobifemoral graft by CTA. 2. 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 A combination of calcified and noncalcified plaque causes subtotal/total occlusive disease. Distal trunk patency is likely reliant mostly on collateral flow from celiac supply from pancreaticoduodenal branches. 3. Moderate stenosis at the origin of the celiac trunk of roughly 50-70%. This may be approaching close to a hemodynamically significant stenosis. This, in combination with the severe SMA disease and prior sacrifice of the IMA would be concerning for enough disease to cause significant chronic mesenteric ischemia. The proximal celiac axis may be amenable to percutaneous intervention in order to try to improve overall mesenteric arterial inflow. Based on the appearance of the SMA, it may not be possible to stent the proximal SMA. Arteriographic assessment and consultation with IR may be helpful. 4. Chronic wall thickening the distal descending colon and juncture of the descending and sigmoid colon. Degree  of wall thickening and inflammation is not as severe as the 2018 scan but may continue to reflect some degree of chronic ischemic colitis. 5. Hepatic steatosis. Electronically Signed   By: Aletta Edouard M.D.   On: 04/07/2020 17:20    Labs:  CBC: Recent Labs    04/01/20 1051  WBC 8.3  HGB 9.7*  HCT 31.0*  PLT 345.0    COAGS: No results for input(s): INR, APTT in the last 8760 hours.  BMP: Recent Labs    04/01/20 1051  NA 139  K 4.6  CL 104  CO2 26  GLUCOSE 103*  BUN 8  CALCIUM 9.7  CREATININE 0.70    LIVER FUNCTION TESTS: Recent Labs    04/01/20 1051  BILITOT 0.3  AST 9  ALT 7  ALKPHOS 70  PROT 6.6  ALBUMIN 4.2    TUMOR MARKERS: No results for input(s): AFPTM, CEA, CA199, CHROMGRNA in the last 8760 hours.  Assessment and Plan:  My impression is that this patient's imaging and clinical presentation is consistent with occlusive mesenteric ischemia with recurrent colitis.  The origin stenoses of the celiac axis and/or SMA may be approachable for percutaneous stent assisted angioplasty.  Open surgical grafting would be an equivalent option as far as patency but obviously much more invasive especially given the multiple abdominal surgeries including abdominal mesh.  I discussed with the patient the pathophysiology of mesenteric ischemia, the typical mesenteric arterial supply and anastomoses, and the pathologic changes in her own anatomy.  We discussed treatment options including watchful waiting, percutaneous intervention, and open surgical repair.  We discussed the details of percutaneous arteriography and stent assisted angioplasty from either femoral or radial approach, anticipated benefits, time course to symptom resolution, possible risks and complications.  She seemed to understand, and did ask appropriate questions which were answered.  She is motivated to proceed. She is currently on dual antiplatelet therapy. She is scheduled to have endoscopy and colonoscopy  in about 20 days at which time she will be off her Plavix.  We can plan mesenteric arteriogram with possible stent assisted angioplasty to follow while she is off her Plavix.  I would rather not do this ahead of time and then have her go off the Plavix  shortly after stent deployment for those procedures, due to the risk of stent thrombosis in that scenario.  She was understanding and agreeable to this plan.   Accordingly, we can set her up for mesenteric arteriography and possible angioplasty/stenting under moderate sedation at [facility], on or about Wednesday, December 1.  Thank you for this interesting consult.  I greatly enjoyed meeting Juanita Streight and look forward to participating in their care.  A copy of this report was sent to the requesting provider on this date.  Electronically Signed: Rickard Rhymes 04/15/2020, 2:26 PM   I spent a total of  30 Minutes   in remote  clinical consultation, greater than 50% of which was counseling/coordinating care for proximal occlusive mesenteric ischemia.    Visit type: Audio only (telephone). Audio (no video) only due to patient's lack of internet/smartphone capability. Alternative for in-person consultation at [facility], [address] [place], [place], [place]. This visit type was conducted due to national recommendations for restrictions regarding the COVID-19 Pandemic (e.g. social distancing).  This format is felt to be most appropriate for this patient at this time.  All issues noted in this document were discussed and addressed.

## 2020-04-15 NOTE — Progress Notes (Signed)
Patient is here in-person for PV. Patient denies any allergies to eggs or soy. Patient denies any problems with anesthesia/sedation. Patient denies any oxygen use at home. Patient denies taking any diet/weight loss medications or blood thinners. Patient is not being treated for MRSA . Patient is aware of our care-partner policy and [id number] safety protocol. COVID-19 vaccines completed on 09/2019, per patient.  During pre-visit patient was asked when did she start Vancomycin for C-diff +, patient states on 04/10/2020, patient denies any diarrhea at this time during PV. I explained to the patient our C-diff policy states she needs to have completed antibiotic Vancomycin for the C-diff and be symptom free before colon should be done here in [place]. Explained to the patient that once she completed the antibiotic if she had any GI symptoms, any diarrhea before the colonoscopy to call us. Pt verbalizes understanding.  This patient information was discussed with Joylene John, RN/infection control.

## 2020-04-15 NOTE — Telephone Encounter (Signed)
Patient is calling to follow up on previous message. Wants to schedule for the infusion

## 2020-04-16 ENCOUNTER — Telehealth: Payer: Self-pay | Admitting: Oncology

## 2020-04-16 NOTE — Telephone Encounter (Signed)
Scheduled appointment per 11/8 new pt. Referral. Spoke to patient who is aware of appointment date and time.

## 2020-04-27 ENCOUNTER — Telehealth: Payer: Self-pay | Admitting: Gastroenterology

## 2020-04-27 NOTE — Telephone Encounter (Signed)
Spoke to patient who continues to complain of intermittent abdominal pain. She is drinking mostly Boost for her diet needs.She was advised to contact her PCP who handles her diabetic medication to let them know about her diet changes. She has an appointment for EGD/Colon on 05/05/20 then with IR to discuss mesenteric angioplasty on 05/06/20. She stated she feels she can hold off until the procedures. She will go to the ED if her pain worsens. Patient voiced understanding.

## 2020-04-28 NOTE — Telephone Encounter (Signed)
I think that is a great plan RG

## 2020-05-05 ENCOUNTER — Encounter: Payer: Self-pay | Admitting: Gastroenterology

## 2020-05-05 ENCOUNTER — Other Ambulatory Visit: Payer: Self-pay | Admitting: Student

## 2020-05-05 ENCOUNTER — Ambulatory Visit (AMBULATORY_SURGERY_CENTER): Payer: Medicare PPO | Admitting: Gastroenterology

## 2020-05-05 ENCOUNTER — Other Ambulatory Visit: Payer: Self-pay

## 2020-05-05 VITALS — BP 109/52 | HR 81 | Temp 97.8°F | Resp 0 | Ht 60.0 in | Wt 124.0 lb

## 2020-05-05 DIAGNOSIS — K529 Noninfective gastroenteritis and colitis, unspecified: Secondary | ICD-10-CM | POA: Diagnosis not present

## 2020-05-05 DIAGNOSIS — D509 Iron deficiency anemia, unspecified: Secondary | ICD-10-CM

## 2020-05-05 DIAGNOSIS — K259 Gastric ulcer, unspecified as acute or chronic, without hemorrhage or perforation: Secondary | ICD-10-CM

## 2020-05-05 DIAGNOSIS — K2950 Unspecified chronic gastritis without bleeding: Secondary | ICD-10-CM | POA: Diagnosis not present

## 2020-05-05 DIAGNOSIS — K449 Diaphragmatic hernia without obstruction or gangrene: Secondary | ICD-10-CM | POA: Diagnosis not present

## 2020-05-05 DIAGNOSIS — K2990 Gastroduodenitis, unspecified, without bleeding: Secondary | ICD-10-CM | POA: Diagnosis not present

## 2020-05-05 DIAGNOSIS — K5730 Diverticulosis of large intestine without perforation or abscess without bleeding: Secondary | ICD-10-CM | POA: Diagnosis not present

## 2020-05-05 DIAGNOSIS — K648 Other hemorrhoids: Secondary | ICD-10-CM | POA: Diagnosis not present

## 2020-05-05 DIAGNOSIS — D500 Iron deficiency anemia secondary to blood loss (chronic): Secondary | ICD-10-CM | POA: Diagnosis not present

## 2020-05-05 DIAGNOSIS — K2970 Gastritis, unspecified, without bleeding: Secondary | ICD-10-CM

## 2020-05-05 DIAGNOSIS — K635 Polyp of colon: Secondary | ICD-10-CM | POA: Diagnosis not present

## 2020-05-05 DIAGNOSIS — Z8719 Personal history of other diseases of the digestive system: Secondary | ICD-10-CM | POA: Diagnosis not present

## 2020-05-05 MED ORDER — PANTOPRAZOLE SODIUM 40 MG PO TBEC: 40.0000 mg | DELAYED_RELEASE_TABLET | Freq: Two times a day (BID) | ORAL | 6 refills | Status: DC

## 2020-05-05 MED ORDER — SODIUM CHLORIDE 0.9 % IV SOLN: 500.0000 mL | Freq: Once | INTRAVENOUS | Status: DC

## 2020-05-05 NOTE — Op Note (Addendum)
[facility] Patient Name: Marisa Callahan Procedure Date: 05/05/2020 3:07 PM MRN: [id number] Endoscopist: Jackquline Denmark , MD Age: 69 Referring MD:  Date of Birth: Nov 18, 1950 Gender: Female Account #: [id number] Procedure:                Upper GI endoscopy Indications:              Epigastric abdominal pain. IDA Medicines:                Monitored Anesthesia Care Procedure:                Pre-Anesthesia Assessment:                           - Prior to the procedure, a History and Physical                            was performed, and patient medications and                            allergies were reviewed. The patient's tolerance of                            previous anesthesia was also reviewed. The risks                            and benefits of the procedure and the sedation                            options and risks were discussed with the patient.                            All questions were answered, and informed consent                            was obtained. Prior Anticoagulants: The patient has                            taken Plavix (clopidogrel), last dose was 5 days                            prior to procedure. ASA Grade Assessment: III - A                            patient with severe systemic disease. After                            reviewing the risks and benefits, the patient was                            deemed in satisfactory condition to undergo the                            procedure.                           -  Prior to the procedure, a History and Physical                            was performed, and patient medications and                            allergies were reviewed. The patient's tolerance of                            previous anesthesia was also reviewed. The risks                            and benefits of the procedure and the sedation                            options and risks were discussed with the patient.                             All questions were answered, and informed consent                            was obtained. Prior Anticoagulants: The patient has                            taken Plavix (clopidogrel), last dose was 5 days                            prior to procedure. ASA Grade Assessment: III - A                            patient with severe systemic disease. After                            reviewing the risks and benefits, the patient was                            deemed in satisfactory condition to undergo the                            procedure.                           After obtaining informed consent, the endoscope was                            passed under direct vision. Throughout the                            procedure, the patient's blood pressure, pulse, and                            oxygen saturations were monitored continuously. The  Endoscope was introduced through the mouth, and                            advanced to the second part of duodenum. The upper                            GI endoscopy was accomplished without difficulty.                            The patient tolerated the procedure well. Scope In: Scope Out: Findings:                 The examined esophagus was normal with well-defined                            Z-line at 35 cm. Examined by NBI.                           A 3 cm hiatal hernia was present.                           Three non-bleeding linear gastric ulcers with no                            stigmata of bleeding were found in the gastric                            antrum. The largest lesion was 6 mm in largest                            dimension. Biopsies were taken with a cold forceps                            for histology.                           The examined duodenum was normal. Biopsies for                            histology were taken with a cold forceps for                            evaluation of celiac  disease. Complications:            No immediate complications. Estimated Blood Loss:     Estimated blood loss: none. Impression:               - 3 cm hiatal hernia.                           - Non-bleeding gastric ulcers with no stigmata of                            bleeding. Biopsied. Recommendation:           - Patient has a contact number available for  emergencies. The signs and symptoms of potential                            delayed complications were discussed with the                            patient. Return to normal activities tomorrow.                            Written discharge instructions were provided to the                            patient.                           - Resume previous diet.                           - Protonix 40 mg p.o. BID #60, 6 refills.                           - Can continue aspirin.                           - She is awaiting mesenteric angiography by IR                            tomorrow. We will defer decision of resuming Plavix                            to IR. From GI standpoint she can resume Plavix                            from Dec 2                           - Await pathology results.                           - No ibuprofen, naproxen, or other non-steroidal                            anti-inflammatory drugs.                           - Stop marijuana                           - Recommend repeating EGD in 12 weeks to ensure                            healing.                           - The findings and recommendations were discussed  with the patient's husband Marisa Callahan. Jackquline Denmark, MD 05/05/2020 4:09:35 PM This report has been signed electronically.

## 2020-05-05 NOTE — H&P (Signed)
Chief Complaint: Patient was seen in consultation today for recurrent ischemic colitis  Referring Physician(s): Dr. Lyndel Safe   Supervising Physician: Corrie Mckusick  Patient Status: [facility] - Out-pt  History of Present Illness: Donalda Job is a 69 y.o. female with a medical history significant for aortoiliac disease s/p aortobifemoral graft placement, ventral mesh hernia repair, coronary artery disease, DM and myocardial infarction. 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 The patient also has a history of ischemic colitis, abdominal pain and C. Diff. Her PCP recently ordered imaging for further evaluation.   CT Angio Abdomen/Pelvis 04/07/20 IMPRESSION: 1. Stable appearance and normal patency of aortobifemoral graft by CTA. 2. 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 A combination of calcified and noncalcified plaque causes subtotal/total occlusive disease. Distal trunk patency is likely reliant mostly on collateral flow from celiac supply from pancreaticoduodenal branches. 3. Moderate stenosis at the origin of the celiac trunk of roughly 50-70%. This may be approaching close to a hemodynamically significant stenosis. This, in combination with the severe SMA disease and prior sacrifice of the IMA would be concerning for enough disease to cause significant chronic mesenteric ischemia. The proximal celiac axis may be amenable to percutaneous intervention in order to try to improve overall mesenteric arterial inflow. Based on the appearance of the SMA, it may not be possible to stent the proximal SMA. Arteriographic assessment and consultation with IR may be helpful. 4. Chronic wall thickening the distal descending colon and juncture of the descending and sigmoid colon. Degree of wall thickening and inflammation is not as severe as the 2018  scan but may continue to reflect some degree of chronic ischemic colitis. 5. Hepatic steatosis.  The patient met with Dr. Vernard Gambles 04/15/20 (tele-visit) for a consultation regarding possible treatment options for her occlusive mesenteric ischemia with recurrent colitis. Several options for therapy were discussed including a mesenteric arteriogram with possible angioplasty/stenting. This is the option the patient elected to pursue. Of note, the patient had a colonoscopy yesterday 05/05/20 which she states was uneventful.   Past Medical History:  Diagnosis Date  . Abdominal pain 02/28/2017  . Anxiety 02/28/2017  . Arthritis 02/28/2017  . Atherosclerosis of native artery of both lower extremities ([place]) 09/28/2015  . Bilateral carotid artery stenosis 09/28/2015  . Boil of buttock 02/28/2017   Overview:  right  . Cancer ([place]) 1977   cervix   . Colitis 02/28/2017  . Colon polyp   . Coronary artery disease 02/28/2017   Overview:  history of coronary stents  . Dehydration 02/28/2017  . Depression 02/28/2017  . Diabetes mellitus ([place]) 02/28/2017  . GERD (gastroesophageal reflux disease) 02/28/2017  . GI bleed   . History of AAA (abdominal aortic aneurysm) repair 12/03/2009   with [place]  . Hypercholesteremia 02/28/2017  . Hypertension 02/28/2017  . Hypotension 02/28/2017   [facility] 02/19/17  . IDA (iron deficiency anemia)   . Incisional hernia, incarcerated 02/28/2017  . Left bundle branch block 02/28/2017  . 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 MI (myocardial infarction) ([place]) 1999  . Osteoarthritis of spine 02/28/2017  . PVD (peripheral vascular disease) ([place]) 09/28/2015  . Sinus tachycardia   . Stroke ([place]) 02/14/2014   right eye stroke  . UTI (urinary tract infection) 02/28/2017  . Ventral hernia 09/28/2015  Past Surgical History:  Procedure Laterality Date  . ABDOMINAL AORTIC ANEURYSM REPAIR     open  . ABDOMINAL HERNIA REPAIR      with dehiscence and chronic wound  . ABDOMINAL HYSTERECTOMY    . BACK SURGERY     as a child  . BYPASS GRAFT    . CARDIAC CATHETERIZATION    . CESAREAN SECTION     x2  . CHOLECYSTECTOMY    . COLONOSCOPY  02/12/2017   Ischemic colitis (involving distal transvere colon, descending colon and proximal sigmoid colon) Moderate sigmoid diverticulosis  . HAND SURGERY Bilateral    repair  . HERNIA REPAIR  2017  . IR RADIOLOGIST EVAL & MGMT  04/15/2020  . TRIGGER FINGER RELEASE     x2    Allergies: Tape  Medications: Prior to Admission medications   Medication Sig Start Date End Date Taking? Authorizing Provider  acetaminophen (TYLENOL) 500 MG tablet Take 1,000-1,500 mg by mouth every 8 (eight) hours as needed for moderate pain or headache.   Yes [provider]  aspirin EC 81 MG tablet Take 81 mg by mouth at bedtime.    Yes [provider]  atorvastatin (LIPITOR) 40 MG tablet Take 40 mg by mouth at bedtime.   Yes [provider]  bismuth subsalicylate (PEPTO BISMOL) 262 MG/15ML suspension Take 30 mLs by mouth every 6 (six) hours as needed for indigestion or diarrhea or loose stools.   Yes [provider]  Calcium Carb-Cholecalciferol (CALCIUM 600 + D PO) Take 1 tablet by mouth 2 (two) times daily.   Yes [provider]  citalopram (CELEXA) 20 MG tablet Take 20 mg by mouth daily.   Yes [provider]  clopidogrel (PLAVIX) 75 MG tablet Take 1 tablet (75 mg total) by mouth daily. 03/16/20  Yes Richardo Priest, MD  diclofenac Sodium (VOLTAREN) 1 % GEL Apply 1 application topically daily as needed (pain).   Yes [provider]  diltiazem (CARDIZEM CD) 120 MG 24 hr capsule Take 1 capsule (120 mg total) by mouth daily. 03/16/20  Yes Richardo Priest, MD  ezetimibe (ZETIA) 10 MG tablet Take 10 mg by mouth daily.   Yes [provider]  folic acid (FOLVITE) 1 MG tablet Take 1 mg by mouth daily.   Yes [provider]    lisinopril (ZESTRIL) 30 MG tablet Take 30 mg by mouth daily.  10/07/19  Yes [provider]  nitroGLYCERIN (NITROSTAT) 0.4 MG SL tablet Place 1 tablet (0.4 mg total) under the tongue every 5 (five) minutes as needed for chest pain. 07/22/19 04/29/20 Yes Richardo Priest, MD  pantoprazole (PROTONIX) 40 MG tablet Take 40 mg by mouth 2 (two) times daily.    Yes [provider]  Sodium Chloride-Sodium Bicarb (NETI POT SINUS [place] NA) Place 1 Dose into the nose daily as needed (congestion).   Yes [provider]  ACCU-CHEK GUIDE test strip  10/26/19   [provider]  Accu-Chek Softclix Lancets lancets  10/26/19   [provider]  Blood Glucose Calibration (ACCU-CHEK GUIDE CONTROL) LIQD  10/28/19   [provider]  Blood Glucose Monitoring Suppl (ACCU-CHEK GUIDE) w/Device KIT  10/28/19   [provider]  metFORMIN (GLUCOPHAGE) 850 MG tablet Take 850 mg by mouth daily with breakfast.  Patient not taking: Reported on 05/05/2020    [provider]     Family History  Problem Relation Age of Onset  . Hypertension Mother   .  Diabetes Mother   . Stroke Mother   . Congestive Heart Failure Mother   . Hypertension Sister   . Diabetes Sister   . Congestive Heart Failure Sister   . Diabetes Brother   . Colon cancer Neg Hx   . Colon polyps Neg Hx   . Esophageal cancer Neg Hx   . Rectal cancer Neg Hx   . Stomach cancer Neg Hx     Social History   Socioeconomic History  . Marital status: Married    Spouse name: Not on file  . Number of children: Not on file  . Years of education: Not on file  . Highest education level: Not on file  Occupational History  . Not on file  Tobacco Use  . Smoking status: Former Smoker    Types: Cigarettes    Quit date: 1999    Years since quitting: 22.9  . Smokeless tobacco: Never Used  Vaping Use  . Vaping Use: Never used  Substance and Sexual Activity  . Alcohol use: No  . Drug use: Yes     Frequency: 7.0 times per week    Types: Marijuana    Comment: last on 05/04/20  . Sexual activity: Not on file  Other Topics Concern  . Not on file  Social History Narrative  . Not on file   Social Determinants of Health   Financial Resource Strain:   . Difficulty of Paying Living Expenses: Not on file  Food Insecurity:   . Worried About Charity fundraiser in the Last Year: Not on file  . Ran Out of Food in the Last Year: Not on file  Transportation Needs:   . Lack of Transportation (Medical): Not on file  . Lack of Transportation (Non-Medical): Not on file  Physical Activity:   . Days of Exercise per Week: Not on file  . Minutes of Exercise per Session: Not on file  Stress:   . Feeling of Stress : Not on file  Social Connections:   . Frequency of Communication with Friends and Family: Not on file  . Frequency of Social Gatherings with Friends and Family: Not on file  . Attends Religious Services: Not on file  . Active Member of Clubs or Organizations: Not on file  . Attends Archivist Meetings: Not on file  . Marital Status: Not on file    Review of Systems: A 12 point ROS discussed and pertinent positives are indicated in the HPI above.  All other systems are negative.  Review of Systems  Constitutional: Negative for appetite change and fatigue.  Respiratory: Negative for cough and shortness of breath.   Cardiovascular: Negative for chest pain and leg swelling.  Gastrointestinal: Negative for abdominal pain, diarrhea, nausea and vomiting.  Musculoskeletal: Positive for back pain.       Mild, chronic  Neurological: Negative for dizziness and headaches.    Vital Signs: BP 122/68   Pulse 89   Temp 98.4 F (36.9 C) (Oral)   Resp 16   Ht 5' (1.524 m)   Wt 125 lb (56.7 kg)   SpO2 100%   BMI 24.41 kg/m   Physical Exam Constitutional:      General: She is not in acute distress. HENT:     Mouth/Throat:     Mouth: Mucous membranes are moist.      Pharynx: Oropharynx is clear.     Comments: Full dentures upper, partial dentures lower Cardiovascular:     Rate and Rhythm: Normal  rate and regular rhythm.     Pulses: Normal pulses.     Heart sounds: Normal heart sounds.  Pulmonary:     Effort: Pulmonary effort is normal.     Breath sounds: Normal breath sounds.  Abdominal:     General: Bowel sounds are normal.     Palpations: Abdomen is soft.  Musculoskeletal:        General: Normal range of motion.     Cervical back: Normal range of motion.  Skin:    General: Skin is warm and dry.  Neurological:     Mental Status: She is alert and oriented to person, place, and time.     Imaging: IR Radiologist Eval & Mgmt  Result Date: 04/15/2020 Please refer to notes tab for details about interventional procedure. (Op Note)  CT Angio Abd/Pel w/ and/or w/o  Result Date: 04/07/2020 CLINICAL DATA:  Diarrhea, weight loss and postprandial abdominal pain. History of ischemic colitis. EXAM: CT ANGIOGRAPHY ABDOMEN AND PELVIS WITH CONTRAST TECHNIQUE: Multidetector CT imaging of the abdomen and pelvis was performed using the standard protocol during bolus administration of intravenous contrast. Multiplanar reconstructed images and MIPs were obtained and reviewed to evaluate the vascular anatomy. CONTRAST:  139m OMNIPAQUE IOHEXOL 350 MG/ML SOLN COMPARISON:  CT of the abdomen and pelvis on 02/19/2017 and prior CTA of the abdomen on 06/24/2008[facility] FINDINGS: VASCULAR Aorta: Native aorta demonstrates stable atherosclerosis. The aortobifemoral graft demonstrates stable appearance and normal patency by CTA. No evidence of aortic dissection. Celiac: Calcified plaque causes moderate celiac origin stenosis approximately 50-70%. This may be approaching close to a hemodynamically significant stenosis. The celiac trunk did not demonstrate any evidence of stenosis in 2010. Distal branches are patent and normally opacified. SMA: Origin and  proximal trunk of the superior mesenteric artery demonstrates severe disease which was likely present in 2018 but is better characterized on the current CTA. This has clearly progressed since the prior CTA in 2010. A combination of calcified and noncalcified plaque causes subtotal/total occlusive disease. Distal trunk patency is likely reliant mostly on collateral flow from visible pancreaticoduodenal branches. Renals: Mild proximal disease of single renal arteries bilaterally without significant renal artery stenosis. IMA: The IMA has been sacrificed at the time of aortic grafting. There is not very good collateralization visualized by CTA via an arc of [place] or enlarged marginal artery. Inflow: Patent aortobifemoral limbs bilaterally. Proximal Outflow: The femoral bifurcations are normally patent bilaterally. Veins: Venous phase imaging demonstrates normal patency of visualized mesenteric veins, splenic vein and portal vein. The IVC, bilateral renal veins and iliac veins are normally patent. Review of the MIP images confirms the above findings. NON-VASCULAR Lower chest: No acute abnormality.  Stable small hiatal hernia. Hepatobiliary: The liver demonstrates steatosis. Status post cholecystectomy. No biliary dilatation. Pancreas: Unremarkable. No pancreatic ductal dilatation or surrounding inflammatory changes. Spleen: Normal in size without focal abnormality. Adrenals/Urinary Tract: Stable left renal cyst. No hydronephrosis or renal masses. Adrenal glands are normal. The bladder is unremarkable. Stomach/Bowel: Bowel again demonstrates chronic wall thickening the distal descending colon and juncture of the descending and sigmoid colon. Degree of wall thickening and inflammation is not as severe as the 2018 scan but may continue to reflect some degree chronic ischemic colitis. There is no associated bowel obstruction, free intraperitoneal air or abscess. Lymphatic: No enlarged abdominal or pelvic lymph nodes.  Reproductive: Status post hysterectomy. No adnexal masses. Other: No abdominal wall hernia or abnormality. No abdominopelvic ascites. Musculoskeletal: No acute findings. Progression of  degenerative disc disease at L2-3 since 2018. IMPRESSION: 1. Stable appearance and normal patency of aortobifemoral graft by CTA. 2. 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 A combination of calcified and noncalcified plaque causes subtotal/total occlusive disease. Distal trunk patency is likely reliant mostly on collateral flow from celiac supply from pancreaticoduodenal branches. 3. Moderate stenosis at the origin of the celiac trunk of roughly 50-70%. This may be approaching close to a hemodynamically significant stenosis. This, in combination with the severe SMA disease and prior sacrifice of the IMA would be concerning for enough disease to cause significant chronic mesenteric ischemia. The proximal celiac axis may be amenable to percutaneous intervention in order to try to improve overall mesenteric arterial inflow. Based on the appearance of the SMA, it may not be possible to stent the proximal SMA. Arteriographic assessment and consultation with IR may be helpful. 4. Chronic wall thickening the distal descending colon and juncture of the descending and sigmoid colon. Degree of wall thickening and inflammation is not as severe as the 2018 scan but may continue to reflect some degree of chronic ischemic colitis. 5. Hepatic steatosis. Electronically Signed   By: Aletta Edouard M.D.   On: 04/07/2020 17:20    Labs:  CBC: Recent Labs    04/01/20 1051 05/06/20 0646  WBC 8.3 11.4*  HGB 9.7* 10.5*  HCT 31.0* 36.6  PLT 345.0 499*    COAGS: Recent Labs    05/06/20 0646  INR 1.0    BMP: Recent Labs    04/01/20 1051 05/06/20 0646  NA 139 133*  K 4.6 3.3*  CL 104 98  CO2 26 20*  GLUCOSE  103* 156*  BUN 8 7*  CALCIUM 9.7 9.7  CREATININE 0.70 1.04*  GFRNONAA  --  59*    LIVER FUNCTION TESTS: Recent Labs    04/01/20 1051  BILITOT 0.3  AST 9  ALT 7  ALKPHOS 70  PROT 6.6  ALBUMIN 4.2    TUMOR MARKERS: No results for input(s): AFPTM, CEA, CA199, CHROMGRNA in the last 8760 hours.  Assessment and Plan:  Occlusive mesenteric ischemia with recurrent colitis: Charlene Brooke, 69 year old female, presents today to the [facility] department for an image-guided mesenteric arteriogram with possible stent-assisted angioplasty.  Risks and benefits of of this procedure were discussed with the patient including, but not limited to bleeding, infection, vascular injury or contrast induced renal failure.  This interventional procedure involves the use of X-rays and because of the nature of the planned procedure, it is possible that we will have prolonged use of X-ray fluoroscopy.  Potential radiation risks to you include (but are not limited to) the following: - A slightly elevated risk for cancer  several years later in life. This risk is typically less than 0.5% percent. This risk is low in comparison to the normal incidence of human cancer, which is 33% for women and 50% for men according to the [place]. - Radiation induced injury can include skin redness, resembling a rash, tissue breakdown / ulcers and hair loss (which can be temporary or permanent).   The likelihood of either of these occurring depends on the difficulty of the procedure and whether you are sensitive to radiation due to previous procedures, disease, or genetic conditions.   IF your procedure requires a prolonged use of radiation, you will be notified and given written instructions  for further action.  It is your responsibility to monitor the irradiated area for the 2 weeks following the procedure and to notify your physician if you are concerned that you have suffered a  radiation induced injury.    All of the patient's questions were answered, patient is agreeable to proceed.  The patient has been  NPO. Labs and vitals have been reviewed. Her last dose of plavix was 04/29/20.  Consent signed and in chart.  Thank you for this interesting consult.  I greatly enjoyed meeting Loyda Costin and look forward to participating in their care.  A copy of this report was sent to the requesting provider on this date.  Electronically Signed: Soyla Dryer, AGACNP-BC [phone] 05/06/2020, 8:00 AM   I spent a total of  30 Minutes   in face to face in clinical consultation, greater than 50% of which was counseling/coordinating care for mesenteric arteriogram with possible intervention.

## 2020-05-05 NOTE — Progress Notes (Signed)
Called to room to assist during endoscopic procedure.  Patient ID and intended procedure confirmed with present staff. Received instructions for my participation in the procedure from the performing physician.  

## 2020-05-05 NOTE — Patient Instructions (Signed)
Please read handouts provided. Continue present medications. Await pathology results. Continue Aspirin. Begin Protonix 40 mg twice daily. Begin Plavix on Dec. 2 if okay with IR. Stop marijuana. No ibuprofen, naproxen, or other non-steriodal anti-inflammatory drugs. Recommend repeating EGD in 12 weeks.     YOU HAD AN ENDOSCOPIC PROCEDURE TODAY AT [facility]:   Refer to the procedure report that was given to you for any specific questions about what was found during the examination.  If the procedure report does not answer your questions, please call your gastroenterologist to clarify.  If you requested that your care partner not be given the details of your procedure findings, then the procedure report has been included in a sealed envelope for you to review at your convenience later.  YOU SHOULD EXPECT: Some feelings of bloating in the abdomen. Passage of more gas than usual.  Walking can help get rid of the air that was put into your GI tract during the procedure and reduce the bloating. If you had a lower endoscopy (such as a colonoscopy or flexible sigmoidoscopy) you may notice spotting of blood in your stool or on the toilet paper. If you underwent a bowel prep for your procedure, you may not have a normal bowel movement for a few days.  Please Note:  You might notice some irritation and congestion in your nose or some drainage.  This is from the oxygen used during your procedure.  There is no need for concern and it should clear up in a day or so.  SYMPTOMS TO REPORT IMMEDIATELY:   Following lower endoscopy (colonoscopy or flexible sigmoidoscopy):  Excessive amounts of blood in the stool  Significant tenderness or worsening of abdominal pains  Swelling of the abdomen that is new, acute  Fever of 100F or higher   Following upper endoscopy (EGD)  Vomiting of blood or coffee ground material  New chest pain or pain under the shoulder blades  Painful or  persistently difficult swallowing  New shortness of breath  Fever of 100F or higher  Black, tarry-looking stools  For urgent or emergent issues, a gastroenterologist can be reached at any hour by calling [phone]. Do not use MyChart messaging for urgent concerns.    DIET:  We do recommend a small meal at first, but then you may proceed to your regular diet.  Drink plenty of fluids but you should avoid alcoholic beverages for 24 hours.  ACTIVITY:  You should plan to take it easy for the rest of today and you should NOT DRIVE or use heavy machinery until tomorrow (because of the sedation medicines used during the test).    FOLLOW UP: Our staff will call the number listed on your records 48-72 hours following your procedure to check on you and address any questions or concerns that you may have regarding the information given to you following your procedure. If we do not reach you, we will leave a message.  We will attempt to reach you two times.  During this call, we will ask if you have developed any symptoms of COVID 19. If you develop any symptoms (ie: fever, flu-like symptoms, shortness of breath, cough etc.) before then, please call [phone].  If you test positive for Covid 19 in the 2 weeks post procedure, please call and report this information to [place].    If any biopsies were taken you will be contacted by phone or by letter within the next 1-3 weeks.  Please call us at [phone] if you have not heard about the biopsies in 3 weeks.    SIGNATURES/CONFIDENTIALITY: You and/or your care partner have signed paperwork which will be entered into your electronic medical record.  These signatures attest to the fact that that the information above on your After Visit Summary has been reviewed and is understood.  Full responsibility of the confidentiality of this discharge information lies with you and/or your care-partner.

## 2020-05-05 NOTE — Progress Notes (Signed)
Pt's states no medical or surgical changes since previsit or office visit.   VS taken by CW 

## 2020-05-05 NOTE — Op Note (Signed)
[facility] Patient Name: Marisa Callahan Procedure Date: 05/05/2020 3:07 PM MRN: [id number] Endoscopist: Jackquline Denmark , MD Age: 69 Referring MD:  Date of Birth: 05/09/1951 Gender: Female Account #: [id number] Procedure:                Colonoscopy Indications:              Chronic diarrhea, Iron deficiency anemia. H/O                            ischemic colitis. Medicines:                Monitored Anesthesia Care Procedure:                Pre-Anesthesia Assessment:                           - Prior to the procedure, a History and Physical                            was performed, and patient medications and                            allergies were reviewed. The patient's tolerance of                            previous anesthesia was also reviewed. The risks                            and benefits of the procedure and the sedation                            options and risks were discussed with the patient.                            All questions were answered, and informed consent                            was obtained. Prior Anticoagulants: The patient has                            taken Plavix (clopidogrel), last dose was 5 days                            prior to procedure. ASA Grade Assessment: III - A                            patient with severe systemic disease. After                            reviewing the risks and benefits, the patient was                            deemed in satisfactory condition to undergo the  procedure.                           After obtaining informed consent, the colonoscope                            was passed under direct vision. Throughout the                            procedure, the patient's blood pressure, pulse, and                            oxygen saturations were monitored continuously. The                            Colonoscope was introduced through the anus and                            advanced  to the 2 cm into the ileum. The                            colonoscopy was performed without difficulty. The                            patient tolerated the procedure well. The quality                            of the bowel preparation was adequate to identify                            polyps. The terminal ileum, ileocecal valve,                            appendiceal orifice, and rectum were photographed. Scope In: 3:32:39 PM Scope Out: 3:50:06 PM Scope Withdrawal Time: 0 hours 12 minutes 36 seconds  Total Procedure Duration: 0 hours 17 minutes 27 seconds  Findings:                 The colon (entire examined portion) appeared normal                            with well preserved vascular pattern. A stricture                            (likely ischemic) was visualized in the descending                            colon/proximal sigmoid colon with a luminal                            diameter of approximately 12 mm. The scope PCF H190                            could easily be passed beyond. Biopsies were taken  with a cold forceps for histology. Multiple                            biopsies were also taken throughout the colon and                            sent in separate jar to r/o microscopic colitis.                           Multiple medium-mouthed diverticula were found in                            the sigmoid colon.                           Non-bleeding internal hemorrhoids were found during                            retroflexion and during perianal exam. The                            hemorrhoids were small.                           The terminal ileum appeared normal.                           The exam was otherwise without abnormality on                            direct and retroflexion views. Complications:            No immediate complications. Estimated Blood Loss:     Estimated blood loss: none. Impression:               -Colonic stricture  involving descending                            colon/proximal sigmoid colon without any active                            colitis-likely ischemic given previous H/O ischemic                            colitis (biopsied)                           -Moderate sigmoid diverticulosis.                           -Otherwise normal colonoscopy to TI. Recommendation:           - Patient has a contact number available for                            emergencies. The signs and symptoms of potential  delayed complications were discussed with the                            patient. Return to normal activities tomorrow.                            Written discharge instructions were provided to the                            patient.                           - Resume previous diet.                           - Continue present medications.                           - Await pathology results.                           - We will continue to hold Plavix until mesenteric                            angiography tomorrow. Will defer decision of                            resuming Plavix to IR. She should continue taking                            baby aspirin.                           - The findings and recommendations were discussed                            with the patient's family. Jackquline Denmark, MD 05/05/2020 4:03:03 PM This report has been signed electronically.

## 2020-05-05 NOTE — Progress Notes (Signed)
To PACU, VSS. Report to RN.tb Physician aware of med given for BP.tb

## 2020-05-06 ENCOUNTER — Ambulatory Visit (HOSPITAL_COMMUNITY)
Admission: RE | Admit: 2020-05-06 | Discharge: 2020-05-06 | Disposition: A | Discharge status: Home / Self Care | Payer: Medicare PPO | Source: Ambulatory Visit | Attending: Interventional Radiology | Admitting: Interventional Radiology

## 2020-05-06 ENCOUNTER — Other Ambulatory Visit (HOSPITAL_COMMUNITY): Payer: Self-pay | Admitting: Interventional Radiology

## 2020-05-06 ENCOUNTER — Encounter (HOSPITAL_COMMUNITY): Payer: Self-pay

## 2020-05-06 DIAGNOSIS — K551 Chronic vascular disorders of intestine: Secondary | ICD-10-CM

## 2020-05-06 DIAGNOSIS — Z7982 Long term (current) use of aspirin: Secondary | ICD-10-CM | POA: Insufficient documentation

## 2020-05-06 DIAGNOSIS — Z79899 Other long term (current) drug therapy: Secondary | ICD-10-CM | POA: Diagnosis not present

## 2020-05-06 DIAGNOSIS — Z87891 Personal history of nicotine dependence: Secondary | ICD-10-CM | POA: Insufficient documentation

## 2020-05-06 DIAGNOSIS — I2510 Atherosclerotic heart disease of native coronary artery without angina pectoris: Secondary | ICD-10-CM | POA: Insufficient documentation

## 2020-05-06 DIAGNOSIS — E119 Type 2 diabetes mellitus without complications: Secondary | ICD-10-CM | POA: Insufficient documentation

## 2020-05-06 DIAGNOSIS — K55059 Acute (reversible) ischemia of intestine, part and extent unspecified: Secondary | ICD-10-CM | POA: Diagnosis not present

## 2020-05-06 DIAGNOSIS — I774 Celiac artery compression syndrome: Secondary | ICD-10-CM | POA: Diagnosis not present

## 2020-05-06 HISTORY — PX: IR TRANSCATH PLC STENT 1ST ART NOT LE CV CAR VERT CAR: IMG5443

## 2020-05-06 HISTORY — PX: IR ANGIOGRAM VISCERAL SELECTIVE: IMG657

## 2020-05-06 HISTORY — PX: IR US GUIDE VASC ACCESS RIGHT: IMG2390

## 2020-05-06 LAB — BASIC METABOLIC PANEL
Anion gap: 15 (ref 5–15)
BUN: 7 mg/dL — ABNORMAL LOW (ref 8–23)
CO2: 20 mmol/L — ABNORMAL LOW (ref 22–32)
Calcium: 9.7 mg/dL (ref 8.9–10.3)
Chloride: 98 mmol/L (ref 98–111)
Creatinine, Ser: 1.04 mg/dL — ABNORMAL HIGH (ref 0.44–1.00)
GFR, Estimated: 59 mL/min — ABNORMAL LOW (ref >60)
Glucose, Bld: 156 mg/dL — ABNORMAL HIGH (ref 70–99)
Potassium: 3.3 mmol/L — ABNORMAL LOW (ref 3.5–5.1)
Sodium: 133 mmol/L — ABNORMAL LOW (ref 135–145)

## 2020-05-06 LAB — CBC
HCT: 36.6 % (ref 36.0–46.0)
Hemoglobin: 10.5 g/dL — ABNORMAL LOW (ref 12.0–15.0)
MCH: 21.8 pg — ABNORMAL LOW (ref 26.0–34.0)
MCHC: 28.7 g/dL — ABNORMAL LOW (ref 30.0–36.0)
MCV: 76.1 fL — ABNORMAL LOW (ref 80.0–100.0)
Platelets: 499 10*3/uL — ABNORMAL HIGH (ref 150–400)
RBC: 4.81 MIL/uL (ref 3.87–5.11)
RDW: 18 % — ABNORMAL HIGH (ref 11.5–15.5)
WBC: 11.4 10*3/uL — ABNORMAL HIGH (ref 4.0–10.5)
nRBC: 0 % (ref 0.0–0.2)

## 2020-05-06 LAB — PROTIME-INR
INR: 1 (ref 0.8–1.2)
Prothrombin Time: 12.8 seconds (ref 11.4–15.2)

## 2020-05-06 LAB — GLUCOSE, CAPILLARY: Glucose-Capillary: 84 mg/dL (ref 70–99)

## 2020-05-06 MED ORDER — MIDAZOLAM HCL 2 MG/2ML IJ SOLN
INTRAMUSCULAR | Status: AC | PRN
  Administered 2020-05-06: 0.5 mg via INTRAVENOUS
  Administered 2020-05-06: 1 mg via INTRAVENOUS
  Administered 2020-05-06: 0.5 mg via INTRAVENOUS

## 2020-05-06 MED ORDER — IOHEXOL 300 MG/ML  SOLN
50.0000 mL | Freq: Once | INTRAMUSCULAR | Status: AC | PRN
  Administered 2020-05-06: 50 mL via INTRA_ARTERIAL

## 2020-05-06 MED ORDER — HEPARIN SODIUM (PORCINE) 1000 UNIT/ML IJ SOLN
INTRAMUSCULAR | Status: AC
  Filled 2020-05-06: qty 1

## 2020-05-06 MED ORDER — ASPIRIN 325 MG PO TABS
650.0000 mg | ORAL_TABLET | Freq: Once | ORAL | Status: AC
  Administered 2020-05-06: 650 mg via ORAL

## 2020-05-06 MED ORDER — FENTANYL CITRATE (PF) 100 MCG/2ML IJ SOLN
INTRAMUSCULAR | Status: AC
  Filled 2020-05-06: qty 4

## 2020-05-06 MED ORDER — HEPARIN SODIUM (PORCINE) 1000 UNIT/ML IJ SOLN
INTRAMUSCULAR | Status: AC | PRN
  Administered 2020-05-06: 6000 [IU] via INTRAVENOUS

## 2020-05-06 MED ORDER — LIDOCAINE HCL 1 % IJ SOLN
INTRAMUSCULAR | Status: AC | PRN
  Administered 2020-05-06: 10 mL

## 2020-05-06 MED ORDER — LIDOCAINE HCL 1 % IJ SOLN
INTRAMUSCULAR | Status: AC
  Filled 2020-05-06: qty 20

## 2020-05-06 MED ORDER — MIDAZOLAM HCL 2 MG/2ML IJ SOLN
INTRAMUSCULAR | Status: AC
  Filled 2020-05-06: qty 4

## 2020-05-06 MED ORDER — ONDANSETRON HCL 4 MG/2ML IJ SOLN
INTRAMUSCULAR | Status: AC
  Filled 2020-05-06: qty 2

## 2020-05-06 MED ORDER — SODIUM CHLORIDE 0.9 % IV SOLN: INTRAVENOUS | Status: DC

## 2020-05-06 MED ORDER — SODIUM CHLORIDE 0.9 % IV SOLN
INTRAVENOUS | Status: AC | PRN
  Administered 2020-05-06: 10 mL/h via INTRAVENOUS

## 2020-05-06 MED ORDER — IOHEXOL 300 MG/ML  SOLN
100.0000 mL | Freq: Once | INTRAMUSCULAR | Status: AC | PRN
  Administered 2020-05-06: 75 mL via INTRA_ARTERIAL

## 2020-05-06 MED ORDER — ONDANSETRON HCL 4 MG/2ML IJ SOLN
INTRAMUSCULAR | Status: AC | PRN
  Administered 2020-05-06: 4 mg via INTRAVENOUS

## 2020-05-06 MED ORDER — FENTANYL CITRATE (PF) 100 MCG/2ML IJ SOLN
INTRAMUSCULAR | Status: AC | PRN
Start: 2020-05-06 — End: 2020-05-06
  Administered 2020-05-06: 25 ug via INTRAVENOUS
  Administered 2020-05-06: 50 ug via INTRAVENOUS
  Administered 2020-05-06: 25 ug via INTRAVENOUS

## 2020-05-06 MED ORDER — IOHEXOL 300 MG/ML  SOLN
150.0000 mL | Freq: Once | INTRAMUSCULAR | Status: AC | PRN
  Administered 2020-05-06: 125 mL via INTRA_ARTERIAL

## 2020-05-06 MED ORDER — ASPIRIN 325 MG PO TABS
ORAL_TABLET | ORAL | Status: AC
  Filled 2020-05-06: qty 2

## 2020-05-06 MED ORDER — CLOPIDOGREL BISULFATE 75 MG PO TABS
300.0000 mg | ORAL_TABLET | Freq: Every day | ORAL | Status: DC
  Administered 2020-05-06: 300 mg via ORAL

## 2020-05-06 MED ORDER — CLOPIDOGREL BISULFATE 300 MG PO TABS
ORAL_TABLET | ORAL | Status: AC
  Filled 2020-05-06: qty 1

## 2020-05-06 NOTE — Procedures (Addendum)
Interventional Radiology Procedure Note  Procedure:   US guided R femoral access, via right limb of Aorto-bifem graft.    Aortomesenteric angiogram, celiac artery angiogram.  Balloon mounted stenting of celiac axis, with overlapping 51mm bare-metal Blue stents. Post-dilation with lutonix DEB.   Celt closure device -- indication for immediate ambulation   Complications: None  Recommendations:  - OK for ambulation after sedation - 60 minute bedrest before DC home. May bend hip  - advance diet as tolerated - Strict instructions to continue dual anti-platelet therapy, aspirin and plavix.  Patient confirms she has plavix at home from cardiology Rx. - Do not submerge for 7 days - Routine wound care  - follow up with Dr. Earleen Newport or Dr. Vernard Gambles in clinic 4-6 weeks, no imaging required - follow up with Dr. Earleen Newport as needed  Signed,  Dulcy Fanny. Earleen Newport, DO

## 2020-05-06 NOTE — Sedation Documentation (Signed)
Slight nausea

## 2020-05-06 NOTE — Sedation Documentation (Signed)
Nausea resolved.

## 2020-05-06 NOTE — Discharge Instructions (Addendum)
Angiogram, Care After This sheet gives you information about how to care for yourself after your procedure. Your health care provider may also give you more specific instructions. If you have problems or questions, contact your health care provider. What can I expect after the procedure? After the procedure, it is common to have bruising and tenderness at the catheter insertion area. Follow these instructions at home: Insertion site care  Follow instructions from your health care provider about how to take care of your insertion site. Make sure you: ? Wash your hands with soap and water before you change your bandage (dressing). If soap and water are not available, use hand sanitizer. ? Change your dressing as told by your health care provider. ? Leave stitches (sutures), skin glue, or adhesive strips in place. These skin closures may need to stay in place for 2 weeks or longer. If adhesive strip edges start to loosen and curl up, you may trim the loose edges. Do not remove adhesive strips completely unless your health care provider tells you to do that.  Do not take baths, swim, or use a hot tub until your health care provider approves.  You may shower 24-48 hours after the procedure or as told by your health care provider. ? Gently wash the site with plain soap and water. ? Pat the area dry with a clean towel. ? Do not rub the site. This may cause bleeding.  Do not apply powder or lotion to the site. Keep the site clean and dry.  Check your insertion site every day for signs of infection. Check for: ? Redness, swelling, or pain. ? Fluid or blood. ? Warmth. ? Pus or a bad smell. Activity  Rest as told by your health care provider, usually for 1-2 days.  Do not lift anything that is heavier than 10 lbs. (4.5 kg) or as told by your health care provider.  Do not drive for 24 hours if you were given a medicine to help you relax (sedative).  Do not drive or use heavy machinery while  taking prescription pain medicine. General instructions   Return to your normal activities as told by your health care provider, usually in about a week. Ask your health care provider what activities are safe for you.  If the catheter site starts bleeding, lie flat and put pressure on the site. If the bleeding does not stop, get help right away. This is a medical emergency.  Drink enough fluid to keep your urine clear or pale yellow. This helps flush the contrast dye from your body.  Take over-the-counter and prescription medicines only as told by your health care provider.  Keep all follow-up visits as told by your health care provider. This is important. Contact a health care provider if:  You have a fever or chills.  You have redness, swelling, or pain around your insertion site.  You have fluid or blood coming from your insertion site.  The insertion site feels warm to the touch.  You have pus or a bad smell coming from your insertion site.  You have bruising around the insertion site.  You notice blood collecting in the tissue around the catheter site (hematoma). The hematoma may be painful to the touch. Get help right away if:  You have severe pain at the catheter insertion area.  The catheter insertion area swells very fast.  The catheter insertion area is bleeding, and the bleeding does not stop when you hold steady pressure on the area.    The area near or just beyond the catheter insertion site becomes pale, cool, tingly, or numb. These symptoms may represent a serious problem that is an emergency. Do not wait to see if the symptoms will go away. Get medical help right away. Call your local emergency services (911 in the [place] Do not drive yourself to the hospital. Summary  After the procedure, it is common to have bruising and tenderness at the catheter insertion area.  After the procedure, it is important to rest and drink plenty of fluids.  Do not take baths,  swim, or use a hot tub until your health care provider says it is okay to do so. You may shower 24-48 hours after the procedure or as told by your health care provider.  If the catheter site starts bleeding, lie flat and put pressure on the site. If the bleeding does not stop, get help right away. This is a medical emergency. This information is not intended to replace advice given to you by your health care provider. Make sure you discuss any questions you have with your health care provider. Document Revised: 05/05/2017 Document Reviewed: 04/27/2016 Elsevier Patient Education  [place]. Moderate Conscious Sedation, Adult Sedation is the use of medicines to promote relaxation and relieve discomfort and anxiety. Moderate conscious sedation is a type of sedation. Under moderate conscious sedation, you are less alert than normal, but you are still able to respond to instructions, touch, or both. Moderate conscious sedation is used during short medical and dental procedures. It is milder than deep sedation, which is a type of sedation under which you cannot be easily woken up. It is also milder than general anesthesia, which is the use of medicines to make you unconscious. Moderate conscious sedation allows you to return to your regular activities sooner. Tell a health care provider about:  Any allergies you have.  All medicines you are taking, including vitamins, herbs, eye drops, creams, and over-the-counter medicines.  Use of steroids (by mouth or creams).  Any problems you or family members have had with sedatives and anesthetic medicines.  Any blood disorders you have.  Any surgeries you have had.  Any medical conditions you have, such as sleep apnea.  Whether you are pregnant or may be pregnant.  Any use of cigarettes, alcohol, marijuana, or street drugs. What are the risks? Generally, this is a safe procedure. However, problems may occur, including:  Getting too much  medicine (oversedation).  Nausea.  Allergic reaction to medicines.  Trouble breathing. If this happens, a breathing tube may be used to help with breathing. It will be removed when you are awake and breathing on your own.  Heart trouble.  Lung trouble. What happens before the procedure? Staying hydrated Follow instructions from your health care provider about hydration, which may include:  Up to 2 hours before the procedure - you may continue to drink clear liquids, such as water, clear fruit juice, black coffee, and plain tea. Eating and drinking restrictions Follow instructions from your health care provider about eating and drinking, which may include:  8 hours before the procedure - stop eating heavy meals or foods such as meat, fried foods, or fatty foods.  6 hours before the procedure - stop eating light meals or foods, such as toast or cereal.  6 hours before the procedure - stop drinking milk or drinks that contain milk.  2 hours before the procedure - stop drinking clear liquids. Medicine Ask your health care provider about:  Changing or stopping your regular medicines. This is especially important if you are taking diabetes medicines or blood thinners.  Taking medicines such as aspirin and ibuprofen. These medicines can thin your blood. Do not take these medicines before your procedure if your health care provider instructs you not to.  Tests and exams  You will have a physical exam.  You may have blood tests done to show: ? How well your kidneys and liver are working. ? How well your blood can clot. General instructions  Plan to have someone take you home from the hospital or clinic.  If you will be going home right after the procedure, plan to have someone with you for 24 hours. What happens during the procedure?  An IV tube will be inserted into one of your veins.  Medicine to help you relax (sedative) will be given through the IV tube.  The medical or  dental procedure will be performed. What happens after the procedure?  Your blood pressure, heart rate, breathing rate, and blood oxygen level will be monitored often until the medicines you were given have worn off.  Do not drive for 24 hours. This information is not intended to replace advice given to you by your health care provider. Make sure you discuss any questions you have with your health care provider. Document Revised: 05/05/2017 Document Reviewed: 09/12/2015 Elsevier Patient Education  2020 [employer].

## 2020-05-07 ENCOUNTER — Telehealth: Payer: Self-pay

## 2020-05-07 NOTE — Telephone Encounter (Signed)
LVM

## 2020-05-11 ENCOUNTER — Other Ambulatory Visit: Payer: Self-pay | Admitting: Oncology

## 2020-05-11 DIAGNOSIS — D500 Iron deficiency anemia secondary to blood loss (chronic): Secondary | ICD-10-CM

## 2020-05-11 NOTE — Progress Notes (Signed)
[facility]  [address] [place],  [place]  [address] [phone]  Clinic Day:  05/12/2020  Referring physician: Cher Nakai, MD   HISTORY OF PRESENT ILLNESS:  The patient is a 69 y.o. female  who I was asked to consult upon for anemia.   Labs in October 2021 showed a low hemoglobin of 9.7, with a low MCV of 70.1.   Recent iron studies showed a low ferritin of 5.2, a low serum iron of 18 and a low iron saturation of 3.7%.  The patient denies having any overt forms of blood loss to explain her anemia.  She had both an EGD and colonoscopy in 2021, which showed non-bleeding gastric ulcers and non-bleeding internal hemorrhoids.  The patient denies having any overt forms of blood loss to explain her anemia.  She denies having any previous stomach surgery. She denies being placed on any new medications which have the ability to cause anemia via bone marrow suppression.  The patient claims her mother had issues with anemia.    PAST MEDICAL HISTORY:   Past Medical History:  Diagnosis Date   Abdominal pain 02/28/2017   Anxiety 02/28/2017   Arthritis 02/28/2017   Atherosclerosis of native artery of both lower extremities ([place]) 09/28/2015   Bilateral carotid artery stenosis 09/28/2015   Boil of buttock 02/28/2017   Overview:  right   Cancer ([place]) 1977   cervix    Colitis 02/28/2017   Colon polyp    Coronary artery disease 02/28/2017   Overview:  history of coronary stents   Dehydration 02/28/2017   Depression 02/28/2017   Diabetes mellitus ([place]) 02/28/2017   GERD (gastroesophageal reflux disease) 02/28/2017   GI bleed    History of AAA (abdominal aortic aneurysm) repair 12/03/2009   [place]   Hypercholesteremia 02/28/2017   Hypertension 02/28/2017   Hypotension 02/28/2017   [facility] 02/19/17   IDA (iron deficiency anemia)    Incisional hernia, incarcerated 02/28/2017   Left bundle branch block 02/28/2017    Legally blind in right eye, as defined in [place] 02/28/2017   MI (myocardial infarction) ([place]) 1999   Osteoarthritis of spine 02/28/2017   PVD (peripheral vascular disease) ([place]) 09/28/2015   Sinus tachycardia    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   UTI (urinary tract infection) 02/28/2017   Ventral hernia 09/28/2015    PAST SURGICAL HISTORY:   Past Surgical History:  Procedure Laterality Date   ABDOMINAL AORTIC ANEURYSM REPAIR     open   ABDOMINAL HERNIA REPAIR     with dehiscence and chronic wound   ABDOMINAL HYSTERECTOMY     BACK SURGERY     as a child   BYPASS GRAFT     CARDIAC CATHETERIZATION     CESAREAN SECTION     x2   CHOLECYSTECTOMY     COLONOSCOPY  02/12/2017   Ischemic colitis (involving distal transvere colon, descending colon and proximal sigmoid colon) Moderate sigmoid diverticulosis   HAND SURGERY Bilateral    repair   HERNIA REPAIR  2017   IR ANGIOGRAM VISCERAL SELECTIVE  05/06/2020   IR RADIOLOGIST EVAL & MGMT  04/15/2020   IR TRANSCATH PLC STENT 1ST ART NOT LE CV CAR VERT CAR  05/06/2020   IR US GUIDE VASC ACCESS RIGHT  05/06/2020   TRIGGER FINGER RELEASE     x2    CURRENT MEDICATIONS:   Current Outpatient Medications  Medication Sig Dispense Refill  ACCU-CHEK GUIDE test strip      Accu-Chek Softclix Lancets lancets      acetaminophen (TYLENOL) 500 MG tablet Take 1,000-1,500 mg by mouth every 8 (eight) hours as needed for moderate pain or headache.     aspirin EC 81 MG tablet Take 81 mg by mouth at bedtime.      atorvastatin (LIPITOR) 40 MG tablet Take 40 mg by mouth at bedtime.     bismuth subsalicylate (PEPTO BISMOL) 262 MG/15ML suspension Take 30 mLs by mouth every 6 (six) hours as needed for indigestion or diarrhea or loose stools.     Blood Glucose Calibration (ACCU-CHEK GUIDE CONTROL) LIQD      Blood Glucose Monitoring Suppl (ACCU-CHEK GUIDE) w/Device KIT      Calcium Carb-Cholecalciferol (CALCIUM 600 + D  PO) Take 1 tablet by mouth 2 (two) times daily.     citalopram (CELEXA) 20 MG tablet Take 20 mg by mouth daily.     clopidogrel (PLAVIX) 75 MG tablet Take 1 tablet (75 mg total) by mouth daily. 90 tablet 1   diclofenac Sodium (VOLTAREN) 1 % GEL Apply 1 application topically daily as needed (pain).     diltiazem (CARDIZEM CD) 120 MG 24 hr capsule Take 1 capsule (120 mg total) by mouth daily. 90 capsule 1   ezetimibe (ZETIA) 10 MG tablet Take 10 mg by mouth daily.     folic acid (FOLVITE) 1 MG tablet Take 1 mg by mouth daily.     lisinopril (ZESTRIL) 30 MG tablet Take 30 mg by mouth daily.      metFORMIN (GLUCOPHAGE) 850 MG tablet Take 850 mg by mouth daily with breakfast.      nitroGLYCERIN (NITROSTAT) 0.4 MG SL tablet Place 1 tablet (0.4 mg total) under the tongue every 5 (five) minutes as needed for chest pain. 90 tablet 1   pantoprazole (PROTONIX) 40 MG tablet Take 1 tablet (40 mg total) by mouth 2 (two) times daily before a meal. Take 30 minutes before breakfast and dinner. 60 tablet 6   Sodium Chloride-Sodium Bicarb (NETI POT SINUS [place] NA) Place 1 Dose into the nose daily as needed (congestion).     No current facility-administered medications for this visit.    ALLERGIES:   Allergies  Allergen Reactions   Tape Other (See Comments)    Blisters Cordran surgical tape per MD H&P    FAMILY HISTORY:   Family History  Problem Relation Age of Onset   Hypertension Mother    Diabetes Mother    Stroke Mother    Congestive Heart Failure Mother    Hypertension Sister    Diabetes Sister    Congestive Heart Failure Sister    Depression Sister    Diabetes Brother    Cerebral aneurysm Father    Diabetes Sister    Kidney disease Sister    Colon cancer Neg Hx    Colon polyps Neg Hx    Esophageal cancer Neg Hx    Rectal cancer Neg Hx    Stomach cancer Neg Hx     SOCIAL HISTORY:  The patient was born and raised in [place].  She lives in [place] with  her husband of 53 years.  She has 3 children, 2 grandchildren and 1 great-grandchildren.  She did furniture work for 37 years.  She smoked a pack of cigarettes daily x 25 years. There is no history of alcohol abuse.  REVIEW OF SYSTEMS:  Review of Systems  Constitutional: Positive for fatigue. Negative for  fever.  HENT:   Negative for hearing loss and sore throat.   Eyes: Negative for eye problems.  Respiratory: Negative for chest tightness, cough and hemoptysis.   Cardiovascular: Positive for chest pain. Negative for palpitations.  Gastrointestinal: Negative for abdominal distention, abdominal pain, blood in stool, constipation, diarrhea, nausea and vomiting.  Endocrine: Negative for hot flashes.  Genitourinary: Negative for difficulty urinating, dysuria, frequency, hematuria and nocturia.   Musculoskeletal: Positive for back pain. Negative for arthralgias, gait problem and myalgias.  Skin: Negative.  Negative for itching and rash.  Neurological: Negative.  Negative for dizziness, extremity weakness, gait problem, headaches, light-headedness and numbness.  Hematological: Negative.   Psychiatric/Behavioral: Positive for depression. Negative for suicidal ideas. The patient is not nervous/anxious.      PHYSICAL EXAM:  Blood pressure (!) 166/73, pulse 76, temperature 99.9 F (37.7 C), resp. rate 14, height 5' (1.524 m), weight 134 lb 11.2 oz (61.1 kg), SpO2 99 %. Wt Readings from Last 3 Encounters:  05/12/20 134 lb 11.2 oz (61.1 kg)  05/06/20 125 lb (56.7 kg)  05/05/20 124 lb (56.2 kg)   Body mass index is 26.31 kg/m. Performance status (ECOG): 1 - Symptomatic but completely ambulatory Physical Exam Constitutional:      Appearance: Normal appearance. She is not ill-appearing.  HENT:     Mouth/Throat:     Mouth: Mucous membranes are moist.     Pharynx: Oropharynx is clear. No oropharyngeal exudate or posterior oropharyngeal erythema.  Cardiovascular:     Rate and Rhythm: Normal rate  and regular rhythm.     Heart sounds: No murmur heard. No friction rub. No gallop.   Pulmonary:     Effort: Pulmonary effort is normal. No respiratory distress.     Breath sounds: Normal breath sounds. No wheezing, rhonchi or rales.  Chest:  Breasts:     Right: No axillary adenopathy or supraclavicular adenopathy.     Left: No axillary adenopathy or supraclavicular adenopathy.    Abdominal:     General: Bowel sounds are normal. There is no distension.     Palpations: Abdomen is soft. There is no mass.     Tenderness: There is no abdominal tenderness.  Musculoskeletal:        General: No swelling.     Right lower leg: No edema.     Left lower leg: No edema.  Lymphadenopathy:     Cervical: No cervical adenopathy.     Upper Body:     Right upper body: No supraclavicular or axillary adenopathy.     Left upper body: No supraclavicular or axillary adenopathy.     Lower Body: No right inguinal adenopathy. No left inguinal adenopathy.  Skin:    General: Skin is warm.     Coloration: Skin is not jaundiced.     Findings: No lesion or rash.  Neurological:     General: No focal deficit present.     Mental Status: She is alert and oriented to person, place, and time. Mental status is at baseline.     Cranial Nerves: Cranial nerves are intact.  Psychiatric:        Mood and Affect: Mood normal.        Behavior: Behavior normal.        Thought Content: Thought content normal.    LABS:    CBC Latest Ref Rng & Units 05/12/2020 05/06/2020 04/01/2020  WBC - 8.2 11.4(H) 8.3  Hemoglobin 12.0 - 16.0 9.2(A) 10.5(L) 9.7(L)  Hematocrit 36 - 46 30(A)  36.6 31.0(L)  Platelets 150 - 399 402(A) 499(H) 345.0   CMP Latest Ref Rng & Units 05/12/2020 05/06/2020 04/01/2020  Glucose 70 - 99 mg/dL - 156(H) 103(H)  BUN 4 - 21 9 7(L) 8  Creatinine 0.5 - 1.1 0.6 1.04(H) 0.70  Sodium 137 - 147 139 133(L) 139  Potassium 3.4 - 5.3 3.8 3.3(L) 4.6  Chloride 99 - 108 104 98 104  CO2 13 - 22 26(A) 20(L) 26   Calcium 8.7 - 10.7 9.9 9.7 9.7  Total Protein 6.0 - 8.3 g/dL - - 6.6  Total Bilirubin 0.2 - 1.2 mg/dL - - 0.3  Alkaline Phos 25 - 125 82 - 70  AST 13 - 35 32 - 9  ALT 7 - 35 19 - 7    Lab Results  Component Value Date   FERRITIN 5.2 (L) 04/07/2020   IRONPCTSAT 3.7 (L) 04/07/2020    ASSESSMENT & PLAN:  A 69 y.o. female who I was asked to consult upon for anemia.  Based upon all of her recent labs, the patient clearly has iron deficiency anemia.  Based upon this, I will arrange for her to receive IV Feraheme 1020 mg over these next few weeks to rapidly replenish her iron stores and improve her hemoglobin.  I will see her back in 3 months to reassess her iron and hemoglobin levels to see how well she responded to her upcoming IV iron. The patient understands all the plans discussed today and is in agreement with them.  I do appreciate Cher Nakai, MD for his new consult.   Dafne Nield Macarthur Critchley, MD

## 2020-05-12 ENCOUNTER — Inpatient Hospital Stay: Payer: Medicare PPO | Attending: Oncology

## 2020-05-12 ENCOUNTER — Inpatient Hospital Stay (INDEPENDENT_AMBULATORY_CARE_PROVIDER_SITE_OTHER): Payer: Medicare PPO | Admitting: Oncology

## 2020-05-12 ENCOUNTER — Telehealth: Payer: Self-pay | Admitting: Oncology

## 2020-05-12 ENCOUNTER — Other Ambulatory Visit: Payer: Self-pay | Admitting: Oncology

## 2020-05-12 ENCOUNTER — Other Ambulatory Visit: Payer: Self-pay

## 2020-05-12 ENCOUNTER — Encounter: Payer: Self-pay | Admitting: Oncology

## 2020-05-12 ENCOUNTER — Other Ambulatory Visit: Payer: Self-pay | Admitting: Hematology and Oncology

## 2020-05-12 VITALS — BP 166/73 | HR 76 | Temp 99.9°F | Resp 14 | Ht 60.0 in | Wt 134.7 lb

## 2020-05-12 DIAGNOSIS — D500 Iron deficiency anemia secondary to blood loss (chronic): Secondary | ICD-10-CM | POA: Diagnosis not present

## 2020-05-12 DIAGNOSIS — Z0001 Encounter for general adult medical examination with abnormal findings: Secondary | ICD-10-CM | POA: Diagnosis not present

## 2020-05-12 DIAGNOSIS — D649 Anemia, unspecified: Secondary | ICD-10-CM | POA: Diagnosis not present

## 2020-05-12 DIAGNOSIS — D509 Iron deficiency anemia, unspecified: Secondary | ICD-10-CM | POA: Insufficient documentation

## 2020-05-12 LAB — CBC AND DIFFERENTIAL
HCT: 30 — AB (ref 36–46)
Hemoglobin: 9.2 — AB (ref 12.0–16.0)
Neutrophils Absolute: 6.15
Platelets: 402 — AB (ref 150–399)
WBC: 8.2

## 2020-05-12 LAB — BASIC METABOLIC PANEL
BUN: 9 (ref 4–21)
CO2: 26 — AB (ref 13–22)
Chloride: 104 (ref 99–108)
Creatinine: 0.6 (ref 0.5–1.1)
Glucose: 105
Potassium: 3.8 (ref 3.4–5.3)
Sodium: 139 (ref 137–147)

## 2020-05-12 LAB — HEPATIC FUNCTION PANEL
ALT: 19 (ref 7–35)
AST: 32 (ref 13–35)
Alkaline Phosphatase: 82 (ref 25–125)
Bilirubin, Total: 0.4

## 2020-05-12 LAB — CBC
MCV: 71 — AB (ref 76–111)
RBC: 4.22 (ref 3.87–5.11)

## 2020-05-12 LAB — COMPREHENSIVE METABOLIC PANEL
Albumin: 4.1 (ref 3.5–5.0)
Calcium: 9.9 (ref 8.7–10.7)

## 2020-05-12 NOTE — Telephone Encounter (Signed)
Per 12/7 los next appt given to patient

## 2020-05-13 ENCOUNTER — Other Ambulatory Visit: Payer: Self-pay | Admitting: Interventional Radiology

## 2020-05-13 DIAGNOSIS — K559 Vascular disorder of intestine, unspecified: Secondary | ICD-10-CM

## 2020-05-14 ENCOUNTER — Encounter: Payer: Self-pay | Admitting: Gastroenterology

## 2020-05-14 DIAGNOSIS — D500 Iron deficiency anemia secondary to blood loss (chronic): Secondary | ICD-10-CM | POA: Insufficient documentation

## 2020-05-14 HISTORY — DX: Iron deficiency anemia secondary to blood loss (chronic): D50.0

## 2020-05-19 ENCOUNTER — Encounter: Payer: Self-pay | Admitting: Oncology

## 2020-05-21 ENCOUNTER — Inpatient Hospital Stay: Payer: Medicare PPO

## 2020-05-21 ENCOUNTER — Other Ambulatory Visit: Payer: Self-pay

## 2020-05-21 VITALS — BP 153/67 | HR 68 | Temp 98.3°F | Resp 18 | Ht 60.0 in | Wt 136.1 lb

## 2020-05-21 DIAGNOSIS — D500 Iron deficiency anemia secondary to blood loss (chronic): Secondary | ICD-10-CM

## 2020-05-21 DIAGNOSIS — D509 Iron deficiency anemia, unspecified: Secondary | ICD-10-CM | POA: Diagnosis not present

## 2020-05-21 MED ORDER — SODIUM CHLORIDE 0.9 % IV SOLN
Freq: Once | INTRAVENOUS | Status: AC
  Filled 2020-05-21: qty 250

## 2020-05-21 MED ORDER — SODIUM CHLORIDE 0.9 % IV SOLN
510.0000 mg | Freq: Once | INTRAVENOUS | Status: AC
  Administered 2020-05-21: 510 mg via INTRAVENOUS
  Filled 2020-05-21: qty 510

## 2020-05-21 NOTE — Patient Instructions (Signed)

## 2020-05-21 NOTE — Progress Notes (Signed)
PT STABLE AT TIME OF DISCHARGE 

## 2020-05-28 ENCOUNTER — Inpatient Hospital Stay: Payer: Medicare PPO

## 2020-05-28 ENCOUNTER — Other Ambulatory Visit: Payer: Self-pay

## 2020-05-28 DIAGNOSIS — D500 Iron deficiency anemia secondary to blood loss (chronic): Secondary | ICD-10-CM

## 2020-05-28 DIAGNOSIS — D509 Iron deficiency anemia, unspecified: Secondary | ICD-10-CM | POA: Diagnosis not present

## 2020-05-28 MED ORDER — SODIUM CHLORIDE 0.9 % IV SOLN
510.0000 mg | Freq: Once | INTRAVENOUS | Status: AC
  Administered 2020-05-28: 510 mg via INTRAVENOUS
  Filled 2020-05-28: qty 17

## 2020-05-28 MED ORDER — SODIUM CHLORIDE 0.9 % IV SOLN
Freq: Once | INTRAVENOUS | Status: DC
  Filled 2020-05-28: qty 250

## 2020-06-11 DIAGNOSIS — E1159 Type 2 diabetes mellitus with other circulatory complications: Secondary | ICD-10-CM | POA: Diagnosis not present

## 2020-06-11 DIAGNOSIS — D509 Iron deficiency anemia, unspecified: Secondary | ICD-10-CM | POA: Diagnosis not present

## 2020-06-11 DIAGNOSIS — E7800 Pure hypercholesterolemia, unspecified: Secondary | ICD-10-CM | POA: Diagnosis not present

## 2020-06-11 DIAGNOSIS — I10 Essential (primary) hypertension: Secondary | ICD-10-CM | POA: Diagnosis not present

## 2020-06-11 DIAGNOSIS — I2510 Atherosclerotic heart disease of native coronary artery without angina pectoris: Secondary | ICD-10-CM | POA: Diagnosis not present

## 2020-06-11 DIAGNOSIS — I739 Peripheral vascular disease, unspecified: Secondary | ICD-10-CM | POA: Diagnosis not present

## 2020-06-11 DIAGNOSIS — M109 Gout, unspecified: Secondary | ICD-10-CM | POA: Diagnosis not present

## 2020-06-11 DIAGNOSIS — M8580 Other specified disorders of bone density and structure, unspecified site: Secondary | ICD-10-CM | POA: Diagnosis not present

## 2020-06-11 DIAGNOSIS — R69 Illness, unspecified: Secondary | ICD-10-CM | POA: Diagnosis not present

## 2020-06-11 DIAGNOSIS — K219 Gastro-esophageal reflux disease without esophagitis: Secondary | ICD-10-CM | POA: Diagnosis not present

## 2020-06-11 DIAGNOSIS — Z139 Encounter for screening, unspecified: Secondary | ICD-10-CM | POA: Diagnosis not present

## 2020-06-18 ENCOUNTER — Telehealth: Payer: Medicare PPO

## 2020-07-01 ENCOUNTER — Encounter: Payer: Self-pay | Admitting: *Deleted

## 2020-07-01 ENCOUNTER — Ambulatory Visit
Admission: RE | Admit: 2020-07-01 | Discharge: 2020-07-01 | Disposition: A | Discharge status: Home / Self Care | Payer: Medicare PPO | Source: Ambulatory Visit | Attending: Interventional Radiology | Admitting: Interventional Radiology

## 2020-07-01 ENCOUNTER — Other Ambulatory Visit: Payer: Self-pay

## 2020-07-01 DIAGNOSIS — K559 Vascular disorder of intestine, unspecified: Secondary | ICD-10-CM

## 2020-07-01 DIAGNOSIS — K55059 Acute (reversible) ischemia of intestine, part and extent unspecified: Secondary | ICD-10-CM | POA: Diagnosis not present

## 2020-07-01 HISTORY — PX: IR RADIOLOGIST EVAL & MGMT: IMG5224

## 2020-07-01 NOTE — Progress Notes (Signed)
Chief Complaint: Mesenteric Artery Disease  Referring Physician(s): Dr. Jackquline Denmark  History of Present Illness: Marisa Callahan is a 70 y.o. 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   Marisa Callahan joins [place] today by way of virtual visit, given the COVID crisis.  We confirmed her identity with 2 personal identifiers.   Marisa Callahan was referred to [place] with symptoms of CMI, with weight-loss, discomfort with meals, and multiple episodes of self-limited ischemic colitis as well as Cdiff colitis.  She was seen in our clinic 04/15/20 by my partner Dr. Vernard Gambles.   We treated Marisa Callahan on 05/06/20 with right CFA access, mesenteric angiogram, and treatment of critical celiac artery stenosis with bare metal balloon mounted stents (overlapping [place]). 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  She also has prior IMA ligation secondary to open AAA repair.    She was discharged same day, and she now tells me that she recovered just fine afterwards.  She is very satisfied with her treatment, and tells me that she is eating 2 meals daily with comfort and no post-prandial pain.  She denies nausea, vomiting, hematemesis, melena, hematochezia.  She tells me she feels she has once again achieved a normal schedule of bowel movement.  She is quite comfortable and is maintaining a steady weight around 135 lbs.   She continues to take daily PO aspirin and plavix, prescribed by Dr. Bettina Gavia her cardiologist.   Past Medical History:  Diagnosis Date  . Abdominal pain 02/28/2017  . Anxiety 02/28/2017  . Arthritis 02/28/2017  . Atherosclerosis of native artery of both lower extremities ([place]) 09/28/2015  . Bilateral carotid artery stenosis 09/28/2015  . Boil of buttock 02/28/2017   Overview:  right  . Cancer ([place]) 1977   cervix   . Colitis  02/28/2017  . Colon polyp   . Coronary artery disease 02/28/2017   Overview:  history of coronary stents  . Dehydration 02/28/2017  . Depression 02/28/2017  . Diabetes mellitus ([place]) 02/28/2017  . GERD (gastroesophageal reflux disease) 02/28/2017  . GI bleed   . History of AAA (abdominal aortic aneurysm) repair 12/03/2009   with [place]em [place]  . Hypercholesteremia 02/28/2017  . Hypertension 02/28/2017  . Hypotension 02/28/2017   [facility] 02/19/17  . IDA (iron deficiency anemia)   . Incisional hernia, incarcerated 02/28/2017  . Left bundle branch block 02/28/2017  . 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 MI (myocardial infarction) ([place]) 1999  . Osteoarthritis of spine 02/28/2017  . PVD (peripheral vascular disease) ([place]) 09/28/2015  . Sinus tachycardia   . 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 UTI (urinary tract infection) 02/28/2017  . Ventral hernia 09/28/2015    Past Surgical History:  Procedure Laterality Date  . ABDOMINAL AORTIC ANEURYSM REPAIR     open  . ABDOMINAL HERNIA REPAIR     with dehiscence and chronic wound  . ABDOMINAL HYSTERECTOMY    . BACK SURGERY     as a child  . BYPASS GRAFT    . CARDIAC CATHETERIZATION    . CESAREAN SECTION     x2  . CHOLECYSTECTOMY    . COLONOSCOPY  02/12/2017   Ischemic colitis (involving distal transvere colon, descending colon and proximal sigmoid colon) Moderate sigmoid diverticulosis  .  HAND SURGERY Bilateral    repair  . HERNIA REPAIR  2017  . IR ANGIOGRAM VISCERAL SELECTIVE  05/06/2020  . IR RADIOLOGIST EVAL & MGMT  04/15/2020  . IR TRANSCATH PLC STENT 1ST ART NOT LE CV CAR VERT CAR  05/06/2020  . IR US GUIDE VASC ACCESS RIGHT  05/06/2020  . TRIGGER FINGER RELEASE     x2    Allergies: Tape  Medications: Prior to Admission medications   Medication Sig Start Date End Date Taking? Authorizing Provider  ACCU-CHEK GUIDE test strip  10/26/19   [provider]  Accu-Chek  Softclix Lancets lancets  10/26/19   [provider]  acetaminophen (TYLENOL) 500 MG tablet Take 1,000-1,500 mg by mouth every 8 (eight) hours as needed for moderate pain or headache.    [provider]  aspirin EC 81 MG tablet Take 81 mg by mouth at bedtime.     [provider]  atorvastatin (LIPITOR) 40 MG tablet Take 40 mg by mouth at bedtime.    [provider]  bismuth subsalicylate (PEPTO BISMOL) 262 MG/15ML suspension Take 30 mLs by mouth every 6 (six) hours as needed for indigestion or diarrhea or loose stools.    [provider]  Blood Glucose Calibration (ACCU-CHEK GUIDE CONTROL) LIQD  10/28/19   [provider]  Blood Glucose Monitoring Suppl (ACCU-CHEK GUIDE) w/Device KIT  10/28/19   [provider]  Calcium Carb-Cholecalciferol (CALCIUM 600 + D PO) Take 1 tablet by mouth 2 (two) times daily.    [provider]  citalopram (CELEXA) 20 MG tablet Take 20 mg by mouth daily.    [provider]  clopidogrel (PLAVIX) 75 MG tablet Take 1 tablet (75 mg total) by mouth daily. 03/16/20   Richardo Priest, MD  diclofenac Sodium (VOLTAREN) 1 % GEL Apply 1 application topically daily as needed (pain).    [provider]  diltiazem (CARDIZEM CD) 120 MG 24 hr capsule Take 1 capsule (120 mg total) by mouth daily. 03/16/20   Richardo Priest, MD  ezetimibe (ZETIA) 10 MG tablet Take 10 mg by mouth daily.    [provider]  folic acid (FOLVITE) 1 MG tablet Take 1 mg by mouth daily.    [provider]  lisinopril (ZESTRIL) 30 MG tablet Take 30 mg by mouth daily.  10/07/19   [provider]  metFORMIN (GLUCOPHAGE) 850 MG tablet Take 850 mg by mouth daily with breakfast.     [provider]  nitroGLYCERIN (NITROSTAT) 0.4 MG SL tablet Place 1 tablet (0.4 mg total) under the tongue every 5 (five) minutes as needed for chest pain. 07/22/19 04/29/20  Richardo Priest, MD  pantoprazole (PROTONIX)  40 MG tablet Take 1 tablet (40 mg total) by mouth 2 (two) times daily before a meal. Take 30 minutes before breakfast and dinner. 05/05/20   Jackquline Denmark, MD  Sodium Chloride-Sodium Bicarb (NETI POT SINUS [place] NA) Place 1 Dose into the nose daily as needed (congestion).    [provider]     Family History  Problem Relation Age of Onset  . Hypertension Mother   . Diabetes Mother   . Stroke Mother   . Congestive Heart Failure Mother   . Hypertension Sister   . Diabetes Sister   . Congestive Heart Failure Sister   . Depression Sister   . Diabetes Brother   . Cerebral aneurysm Father   . Diabetes Sister   . Kidney disease Sister   .  Colon cancer Neg Hx   . Colon polyps Neg Hx   . Esophageal cancer Neg Hx   . Rectal cancer Neg Hx   . Stomach cancer Neg Hx     Social History   Socioeconomic History  . Marital status: Married    Spouse name: [name]  . Number of children: 3  . Years of education: Not on file  . Highest education level: 10th grade  Occupational History  . Not on file  Tobacco Use  . Smoking status: Former Smoker    Types: Cigarettes    Quit date: 1999    Years since quitting: 23.0  . Smokeless tobacco: Never Used  Vaping Use  . Vaping Use: Never used  Substance and Sexual Activity  . Alcohol use: No  . Drug use: Yes    Frequency: 7.0 times per week    Types: Marijuana    Comment: last on 05/04/20  . Sexual activity: Yes  Other Topics Concern  . Not on file  Social History Narrative  . Not on file   Social Determinants of Health   Financial Resource Strain: Not on file  Food Insecurity: Not on file  Transportation Needs: Not on file  Physical Activity: Not on file  Stress: Not on file  Social Connections: Not on file      Review of Systems  Review of Systems: A 12 point ROS discussed and pertinent positives are indicated in the HPI above.  All other systems are negative.  Physical Exam No direct physical exam was performed  (except for noted visual exam findings with Video Visits).    Vital Signs: There were no vitals taken for this visit.  Imaging: No results found.  Labs:  CBC: Recent Labs    04/01/20 1051 05/06/20 0646 05/12/20 0000  WBC 8.3 11.4* 8.2  HGB 9.7* 10.5* 9.2*  HCT 31.0* 36.6 30*  PLT 345.0 499* 402*    COAGS: Recent Labs    05/06/20 0646  INR 1.0    BMP: Recent Labs    04/01/20 1051 05/06/20 0646 05/12/20 0000  NA 139 133* 139  K 4.6 3.3* 3.8  CL 104 98 104  CO2 26 20* 26*  GLUCOSE 103* 156*  --   BUN 8 7* 9  CALCIUM 9.7 9.7 9.9  CREATININE 0.70 1.04* 0.6  GFRNONAA  --  59*  --     LIVER FUNCTION TESTS: Recent Labs    04/01/20 1051 05/12/20 0000  BILITOT 0.3  --   AST 9 32  ALT 7 19  ALKPHOS 70 82  PROT 6.6  --   ALBUMIN 4.2 4.1    TUMOR MARKERS: No results for input(s): AFPTM, CEA, CA199, CHROMGRNA in the last 8760 hours.  Assessment and Plan:  Marisa Callahan is a very pleasant 70 year old female presenting as her first follow up after recent treatment for chronic mesenteric ischemia, 05/06/20.   On 05/06/20, we treated her stenotic celiac artery with bare metal stenting, and also confirmed SMA occlusion.    She tells me she is very pleased with the results, and is eating meals comfortably, with no other symptoms.    She continues on dual anti-platelet medication, which I agree with for her pattern of disease.    I discussed with her the follow up, which is close monitoring, particularly in the first 2 years, given that some series report a re-stenosis/retreatment rate of as high as 50% in the first 2 years.  We will  plan on seeing her back in about 6 months.    Plan: - 6 month follow up visit, with no imaging required.  She knows that she may follow up with [place] at any time should she have any problems. - agree with dual anti-platelet treatment.  At the very least, we would want her on DAPT for 3 months after 05/06/20, or there is risk of stent  thrombosis.  - Maximal medical therapy - She also knows that she should seek emergent medical attention should she have any new acute abdominal pain or concerns. - She has follow up with GI in 3 months.     Electronically Signed: Corrie Mckusick 07/01/2020, 9:12 AM   I spent a total of    25 Minutes in remote  clinical consultation, greater than 50% of which was counseling/coordinating care for [place] treatment of CMI with celiac artery stenting.    Visit type: Audio only (telephone). Audio (no video) only due to patient's lack of internet/smartphone capability. Alternative for in-person consultation at [facility], [address] [place], [place], [place]. This visit type was conducted due to national recommendations for restrictions regarding the COVID-19 Pandemic (e.g. social distancing).  This format is felt to be most appropriate for this patient at this time.  All issues noted in this document were discussed and addressed.

## 2020-07-07 ENCOUNTER — Telehealth: Payer: Self-pay | Admitting: Cardiology

## 2020-07-07 MED ORDER — DILTIAZEM HCL ER COATED BEADS 120 MG PO CP24: 120.0000 mg | ORAL_CAPSULE | Freq: Every day | ORAL | 3 refills | Status: DC

## 2020-07-07 NOTE — Telephone Encounter (Signed)
Refill sent in per request.  

## 2020-07-07 NOTE — Telephone Encounter (Signed)
*  STAT* If patient is at the pharmacy, call can be transferred to refill team.   1. Which medications need to be refilled? (please list name of each medication and dose if known) Diltiazem ER   2. Which pharmacy/location (including street and city if local pharmacy) is medication to be sent to?  [facility]  3. Do they need a 30 day or 90 day supply? Not sure     [facility] [phone] fax [phone]   ID [id number] this is the first time the patient is using this mail order the address [address]    patient insurance has chang patient first time with this compan

## 2020-07-08 ENCOUNTER — Telehealth: Payer: Self-pay | Admitting: Cardiology

## 2020-07-08 MED ORDER — CLOPIDOGREL BISULFATE 75 MG PO TABS: 75.0000 mg | ORAL_TABLET | Freq: Every day | ORAL | 3 refills | Status: DC

## 2020-07-08 NOTE — Telephone Encounter (Signed)
Refill sent in per request.  

## 2020-07-08 NOTE — Telephone Encounter (Signed)
Patient came by office and stated she called 07/07/20 for rx refills. She stated she also needs rx for clopidogrel (PLAVIX) 75 MG tablet to be sent to [facility].

## 2020-07-08 NOTE — Progress Notes (Signed)
[facility] GI clinic with me or APP in 3 months as recommended by radiology RG

## 2020-08-09 NOTE — Progress Notes (Signed)
[facility]  [address] [place],  [place]  [address] [phone]  Clinic Day:  08/10/2020  Referring physician: Cher Nakai, MD   HISTORY OF PRESENT ILLNESS:  The patient is a 70 y.o. female  who I recently began seeing for iron deficiency anemia.  She comes in today to reassess her iron and hemoglobin levels after recently receiving IV Feraheme. Since receiving it, she has felt much better.  She continues to deny having any overt forms of blood loss to explain her past iron deficiency anemia.  PHYSICAL EXAM:  Blood pressure (!) 142/69, pulse 66, temperature 98.6 F (37 C), resp. rate 16, height 5' (1.524 m), weight 139 lb 14.4 oz (63.5 kg), SpO2 100 %. Wt Readings from Last 3 Encounters:  08/10/20 139 lb 14.4 oz (63.5 kg)  05/21/20 136 lb 2 oz (61.7 kg)  05/12/20 134 lb 11.2 oz (61.1 kg)   Body mass index is 27.32 kg/m. Performance status (ECOG): 1 - Symptomatic but completely ambulatory Physical Exam Constitutional:      Appearance: Normal appearance. She is not ill-appearing.  HENT:     Mouth/Throat:     Mouth: Mucous membranes are moist.     Pharynx: Oropharynx is clear. No oropharyngeal exudate or posterior oropharyngeal erythema.  Cardiovascular:     Rate and Rhythm: Normal rate and regular rhythm.     Heart sounds: No murmur heard. No friction rub. No gallop.   Pulmonary:     Effort: Pulmonary effort is normal. No respiratory distress.     Breath sounds: Normal breath sounds. No wheezing, rhonchi or rales.  Chest:  Breasts:     Right: No axillary adenopathy or supraclavicular adenopathy.     Left: No axillary adenopathy or supraclavicular adenopathy.    Abdominal:     General: Bowel sounds are normal. There is no distension.     Palpations: Abdomen is soft. There is no mass.     Tenderness: There is no abdominal tenderness.  Musculoskeletal:        General: No swelling.     Right lower leg: No edema.     Left lower leg: No  edema.  Lymphadenopathy:     Cervical: No cervical adenopathy.     Upper Body:     Right upper body: No supraclavicular or axillary adenopathy.     Left upper body: No supraclavicular or axillary adenopathy.     Lower Body: No right inguinal adenopathy. No left inguinal adenopathy.  Skin:    General: Skin is warm.     Coloration: Skin is not jaundiced.     Findings: No lesion or rash.  Neurological:     General: No focal deficit present.     Mental Status: She is alert and oriented to person, place, and time. Mental status is at baseline.     Cranial Nerves: Cranial nerves are intact.  Psychiatric:        Mood and Affect: Mood normal.        Behavior: Behavior normal.        Thought Content: Thought content normal.    LABS:   CBC Latest Ref Rng & Units 08/10/2020 05/12/2020 05/06/2020  WBC - 7.0 8.2 11.4(H)  Hemoglobin 12.0 - 16.0 13.9 9.2(A) 10.5(L)  Hematocrit 36 - 46 42 30(A) 36.6  Platelets 150 - 399 231 402(A) 499(H)     Ref. Range 08/10/2020 10:08  Iron Latest Ref Range: 28 - 170 ug/dL 60  UIBC Latest Units:  ug/dL 301  TIBC Latest Ref Range: 250 - 450 ug/dL 361  Saturation Ratios Latest Ref Range: 10.4 - 31.8 % 17  Ferritin Latest Ref Range: 11 - 307 ng/mL 50    ASSESSMENT & PLAN:  A 70 y.o. female with iron deficiency anemia.  Based upon her labs today, her IV Feraheme 1020 worked extremely effectively in improving her iron stores and hemoglobin. Clinically, the patient is doing well.  I will see her back in 6 months for repeat clinical assessment.  The patient understands all the plans discussed today and is in agreement with them.   Dequincy Macarthur Critchley, MD

## 2020-08-10 ENCOUNTER — Inpatient Hospital Stay: Payer: Medicare HMO

## 2020-08-10 ENCOUNTER — Other Ambulatory Visit: Payer: Self-pay

## 2020-08-10 ENCOUNTER — Other Ambulatory Visit: Payer: Self-pay | Admitting: Oncology

## 2020-08-10 ENCOUNTER — Telehealth: Payer: Self-pay | Admitting: Oncology

## 2020-08-10 ENCOUNTER — Other Ambulatory Visit: Payer: Self-pay | Admitting: Hematology and Oncology

## 2020-08-10 ENCOUNTER — Inpatient Hospital Stay: Payer: Medicare HMO | Attending: Oncology | Admitting: Oncology

## 2020-08-10 VITALS — BP 142/69 | HR 66 | Temp 98.6°F | Resp 16 | Ht 60.0 in | Wt 139.9 lb

## 2020-08-10 DIAGNOSIS — Z79899 Other long term (current) drug therapy: Secondary | ICD-10-CM | POA: Insufficient documentation

## 2020-08-10 DIAGNOSIS — D500 Iron deficiency anemia secondary to blood loss (chronic): Secondary | ICD-10-CM | POA: Diagnosis not present

## 2020-08-10 DIAGNOSIS — D509 Iron deficiency anemia, unspecified: Secondary | ICD-10-CM | POA: Diagnosis not present

## 2020-08-10 DIAGNOSIS — D649 Anemia, unspecified: Secondary | ICD-10-CM | POA: Diagnosis not present

## 2020-08-10 LAB — IRON AND TIBC
Iron: 60 ug/dL (ref 28–170)
Saturation Ratios: 17 % (ref 10.4–31.8)
TIBC: 361 ug/dL (ref 250–450)
UIBC: 301 ug/dL

## 2020-08-10 LAB — CBC AND DIFFERENTIAL
HCT: 42 (ref 36–46)
Hemoglobin: 13.9 (ref 12.0–16.0)
Neutrophils Absolute: 4.48
Platelets: 231 (ref 150–399)
WBC: 7

## 2020-08-10 LAB — FERRITIN: Ferritin: 50 ng/mL (ref 11–307)

## 2020-08-10 LAB — CBC: RBC: 4.96 (ref 3.87–5.11)

## 2020-08-10 NOTE — Telephone Encounter (Signed)
Per 3/7 LOS, patient scheduled for Sept Appt's.  Gave patient Appt Summary

## 2020-08-11 LAB — CBC W DIFFERENTIAL (~~LOC~~ CC SCANNED REPORT)

## 2020-09-01 DIAGNOSIS — I2510 Atherosclerotic heart disease of native coronary artery without angina pectoris: Secondary | ICD-10-CM | POA: Diagnosis not present

## 2020-09-01 DIAGNOSIS — I10 Essential (primary) hypertension: Secondary | ICD-10-CM | POA: Diagnosis not present

## 2020-09-01 DIAGNOSIS — M8580 Other specified disorders of bone density and structure, unspecified site: Secondary | ICD-10-CM | POA: Diagnosis not present

## 2020-09-01 DIAGNOSIS — E7800 Pure hypercholesterolemia, unspecified: Secondary | ICD-10-CM | POA: Diagnosis not present

## 2020-09-01 DIAGNOSIS — I739 Peripheral vascular disease, unspecified: Secondary | ICD-10-CM | POA: Diagnosis not present

## 2020-09-01 DIAGNOSIS — D509 Iron deficiency anemia, unspecified: Secondary | ICD-10-CM | POA: Diagnosis not present

## 2020-09-01 DIAGNOSIS — M109 Gout, unspecified: Secondary | ICD-10-CM | POA: Diagnosis not present

## 2020-09-01 DIAGNOSIS — R69 Illness, unspecified: Secondary | ICD-10-CM | POA: Diagnosis not present

## 2020-09-01 DIAGNOSIS — K219 Gastro-esophageal reflux disease without esophagitis: Secondary | ICD-10-CM | POA: Diagnosis not present

## 2020-09-01 DIAGNOSIS — E1159 Type 2 diabetes mellitus with other circulatory complications: Secondary | ICD-10-CM | POA: Diagnosis not present

## 2020-10-13 ENCOUNTER — Encounter: Payer: Self-pay | Admitting: Gastroenterology

## 2020-10-20 DIAGNOSIS — M109 Gout, unspecified: Secondary | ICD-10-CM | POA: Diagnosis not present

## 2020-10-20 DIAGNOSIS — I2510 Atherosclerotic heart disease of native coronary artery without angina pectoris: Secondary | ICD-10-CM | POA: Diagnosis not present

## 2020-10-20 DIAGNOSIS — E663 Overweight: Secondary | ICD-10-CM | POA: Diagnosis not present

## 2020-10-20 DIAGNOSIS — R69 Illness, unspecified: Secondary | ICD-10-CM | POA: Diagnosis not present

## 2020-10-20 DIAGNOSIS — M7701 Medial epicondylitis, right elbow: Secondary | ICD-10-CM | POA: Diagnosis not present

## 2020-10-20 DIAGNOSIS — M8580 Other specified disorders of bone density and structure, unspecified site: Secondary | ICD-10-CM | POA: Diagnosis not present

## 2020-10-20 DIAGNOSIS — K219 Gastro-esophageal reflux disease without esophagitis: Secondary | ICD-10-CM | POA: Diagnosis not present

## 2020-10-20 DIAGNOSIS — I739 Peripheral vascular disease, unspecified: Secondary | ICD-10-CM | POA: Diagnosis not present

## 2020-10-20 DIAGNOSIS — D509 Iron deficiency anemia, unspecified: Secondary | ICD-10-CM | POA: Diagnosis not present

## 2020-10-27 DIAGNOSIS — I739 Peripheral vascular disease, unspecified: Secondary | ICD-10-CM | POA: Diagnosis not present

## 2020-10-27 DIAGNOSIS — D509 Iron deficiency anemia, unspecified: Secondary | ICD-10-CM | POA: Diagnosis not present

## 2020-10-27 DIAGNOSIS — R69 Illness, unspecified: Secondary | ICD-10-CM | POA: Diagnosis not present

## 2020-10-27 DIAGNOSIS — I10 Essential (primary) hypertension: Secondary | ICD-10-CM | POA: Diagnosis not present

## 2020-10-27 DIAGNOSIS — M8580 Other specified disorders of bone density and structure, unspecified site: Secondary | ICD-10-CM | POA: Diagnosis not present

## 2020-10-27 DIAGNOSIS — M25511 Pain in right shoulder: Secondary | ICD-10-CM | POA: Diagnosis not present

## 2020-10-27 DIAGNOSIS — E7800 Pure hypercholesterolemia, unspecified: Secondary | ICD-10-CM | POA: Diagnosis not present

## 2020-10-27 DIAGNOSIS — M109 Gout, unspecified: Secondary | ICD-10-CM | POA: Diagnosis not present

## 2020-10-27 DIAGNOSIS — I2510 Atherosclerotic heart disease of native coronary artery without angina pectoris: Secondary | ICD-10-CM | POA: Diagnosis not present

## 2020-10-27 DIAGNOSIS — E1159 Type 2 diabetes mellitus with other circulatory complications: Secondary | ICD-10-CM | POA: Diagnosis not present

## 2020-11-23 ENCOUNTER — Ambulatory Visit (INDEPENDENT_AMBULATORY_CARE_PROVIDER_SITE_OTHER): Payer: Medicare HMO | Admitting: Gastroenterology

## 2020-11-23 ENCOUNTER — Other Ambulatory Visit: Payer: Self-pay

## 2020-11-23 ENCOUNTER — Encounter: Payer: Self-pay | Admitting: Gastroenterology

## 2020-11-23 VITALS — BP 146/70 | HR 67 | Ht 60.0 in | Wt 140.4 lb

## 2020-11-23 DIAGNOSIS — K449 Diaphragmatic hernia without obstruction or gangrene: Secondary | ICD-10-CM | POA: Diagnosis not present

## 2020-11-23 DIAGNOSIS — Z8719 Personal history of other diseases of the digestive system: Secondary | ICD-10-CM | POA: Diagnosis not present

## 2020-11-23 DIAGNOSIS — K219 Gastro-esophageal reflux disease without esophagitis: Secondary | ICD-10-CM | POA: Diagnosis not present

## 2020-11-23 NOTE — Patient Instructions (Signed)
If you are age 68 or older, your body mass index should be between 23-30. Your Body mass index is 27.42 kg/m. If this is out of the aforementioned range listed, please consider follow up with your Primary Care Provider.  If you are age 30 or younger, your body mass index should be between 19-25. Your Body mass index is 27.42 kg/m. If this is out of the aformentioned range listed, please consider follow up with your Primary Care Provider.   __________________________________________________________  The [facility] providers would like to encourage you to use [facility] to communicate with providers for non-urgent requests or questions.  Due to long hold times on the telephone, sending your provider a message by [facility] may be a faster and more efficient way to get a response.  Please allow 48 business hours for a response.  Please remember that this is for non-urgent requests.   Please call with any questions or concerns.  Follow up as needed.  Thank you,  Dr. Jackquline Denmark

## 2020-11-23 NOTE — Progress Notes (Signed)
Chief Complaint: FU  Referring Provider:  Cher Nakai, MD      ASSESSMENT AND PLAN;   #1.  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  #2. H/O Ischemic colitis on colon 02/2017.  Had LGI bleed requiring 2 units PRBC.  CT Abdo/pelvis showed sigmoid wall thickening.  Managed conservatively.  #3. GERD with HH with H/O ischemic ulcers  #4.  IDA (resolved)  Plan: -Decrease protonix 40mg  QD x 3 months, then can reduce it to every other day. -Hold off on FU EGD -FU PRN    HPI:    Marisa Callahan is a 70 y.o. female  AAA repair with aortofemoral bypass B/L 11/2009, then incisonal hernis repair x 3, DM, PVD, PAD, anxiety/depression, HTN, HLD, CAD s/p MI with chronic mesenteric ischemia  S/P celiac artery stenting 05/06/2020, SMA occlusion with collaterals, IMA ligation at previous open AAA repair, on ASA/Plavix  Doing very well from GI standpoint.  No further abdominal pain.  Her diarrhea has more or less resolved and occurs only if she eats out and has a fatty meal.  No further weight loss.  Pleased with the progress.  Her most recent hemoglobin was normal at 13.7.  Being followed by Dr. Bobby Rumpf for anemia.  She would like to hold off on any follow-up EGD or further GI evaluation as she feels "100% better".  I do believe her gastric ulcers were likely ischemic in nature.  Wt Readings from Last 3 Encounters:  11/23/20 140 lb 6 oz (63.7 kg)  08/10/20 139 lb 14.4 oz (63.5 kg)  05/21/20 136 lb 2 oz (61.7 kg)     Past GI work-up  Colonoscopy 04/2020 -Colonic stricture involving descending colon/proximal sigmoid colon without any active colitislikely ischemic given previous H/O ischemic colitis (biopsied) -Moderate sigmoid diverticulosis. -Otherwise normal colonoscopy to TI.  EGD 04/2020 - 3 cm hiatal hernia. - Non-bleeding gastric ulcers with no stigmata of bleeding. Bx- neg - Neg SB bx for celiac  CTA 04/2020 1. Stable appearance and  normal patency of aortobifemoral graft by CTA. 2. Severe stenosis SMA 3. Moderate stenosis at the origin of the celiac trunk of roughly 50-70%. 4. Chronic wall thickening the distal descending colon and juncture of the descending and sigmoid colon. Degree of wall thickening and inflammation is not as severe as the 2018 scan but may continue to reflect some degree of chronic ischemic colitis. 5. Hepatic steatosis.   Past Medical History:  Diagnosis Date   Abdominal pain 02/28/2017   Anxiety 02/28/2017   Arthritis 02/28/2017   Atherosclerosis of native artery of both lower extremities ([place]) 09/28/2015   Bilateral carotid artery stenosis 09/28/2015   Boil of buttock 02/28/2017   Overview:  right   Cancer [facility]) 1977   cervix    Colitis 02/28/2017   Colon polyp    Coronary artery disease 02/28/2017   Overview:  history of coronary stents   Dehydration 02/28/2017   Depression 02/28/2017   Diabetes mellitus ([place]) 02/28/2017   GERD (gastroesophageal reflux disease) 02/28/2017   GI bleed    History of AAA (abdominal aortic aneurysm) repair 12/03/2009   [place]   Hypercholesteremia 02/28/2017   Hypertension 02/28/2017   Hypotension 02/28/2017   [facility] 02/19/17   IDA (iron deficiency anemia)    Incisional hernia, incarcerated 02/28/2017   Left bundle branch block 02/28/2017   Legally blind in right eye, as defined in [place] 02/28/2017   MI (myocardial infarction) ([place])  1999   Osteoarthritis of spine 02/28/2017   PVD (peripheral vascular disease) ([place]) 09/28/2015   Sinus tachycardia    Stroke ([place]) 02/14/2014   right eye stroke   UTI (urinary tract infection) 02/28/2017   Ventral hernia 09/28/2015    Past Surgical History:  Procedure Laterality Date   ABDOMINAL AORTIC ANEURYSM REPAIR     open   ABDOMINAL HERNIA REPAIR     with dehiscence and chronic wound   ABDOMINAL HYSTERECTOMY     BACK SURGERY     as a child   BYPASS GRAFT     CARDIAC  CATHETERIZATION     CESAREAN SECTION     x2   CHOLECYSTECTOMY     COLONOSCOPY  02/12/2017   Ischemic colitis (involving distal transvere colon, descending colon and proximal sigmoid colon) Moderate sigmoid diverticulosis   HAND SURGERY Bilateral    repair   HERNIA REPAIR  2017   IR ANGIOGRAM VISCERAL SELECTIVE  05/06/2020   IR RADIOLOGIST EVAL & MGMT  04/15/2020   IR RADIOLOGIST EVAL & MGMT  07/01/2020   IR TRANSCATH PLC STENT 1ST ART NOT LE CV CAR VERT CAR  05/06/2020   IR US GUIDE VASC ACCESS RIGHT  05/06/2020   TRIGGER FINGER RELEASE     x2    Family History  Problem Relation Age of Onset   Hypertension Mother    Diabetes Mother    Stroke Mother    Congestive Heart Failure Mother    Hypertension Sister    Diabetes Sister    Congestive Heart Failure Sister    Depression Sister    Diabetes Brother    Cerebral aneurysm Father    Diabetes Sister    Kidney disease Sister    Colon cancer Neg Hx    Colon polyps Neg Hx    Esophageal cancer Neg Hx    Rectal cancer Neg Hx    Stomach cancer Neg Hx     Social History   Tobacco Use   Smoking status: Former    Pack years: 0.00    Types: Cigarettes    Quit date: 1999    Years since quitting: 23.4   Smokeless tobacco: Never  Vaping Use   Vaping Use: Never used  Substance Use Topics   Alcohol use: No   Drug use: Yes    Frequency: 7.0 times per week    Types: Marijuana    Comment: last on 05/04/20    Current Outpatient Medications  Medication Sig Dispense Refill   ACCU-CHEK GUIDE test strip      Accu-Chek Softclix Lancets lancets      acetaminophen (TYLENOL) 500 MG tablet Take 1,000-1,500 mg by mouth every 8 (eight) hours as needed for moderate pain or headache.     aspirin EC 81 MG tablet Take 81 mg by mouth at bedtime.      atorvastatin (LIPITOR) 40 MG tablet Take 40 mg by mouth at bedtime.     Blood Glucose Calibration (ACCU-CHEK GUIDE CONTROL) LIQD      Blood Glucose Monitoring Suppl (ACCU-CHEK GUIDE) w/Device KIT       Calcium Carb-Cholecalciferol (CALCIUM 600 + D PO) Take 1 tablet by mouth 2 (two) times daily.     citalopram (CELEXA) 20 MG tablet Take 20 mg by mouth daily.     clopidogrel (PLAVIX) 75 MG tablet Take 1 tablet (75 mg total) by mouth daily. 90 tablet 3   diclofenac Sodium (VOLTAREN) 1 % GEL Apply 1 application topically daily as needed (  pain).     diltiazem (CARDIZEM CD) 120 MG 24 hr capsule Take 1 capsule (120 mg total) by mouth daily. 90 capsule 3   ezetimibe (ZETIA) 10 MG tablet Take 10 mg by mouth daily.     folic acid (FOLVITE) 1 MG tablet Take 1 mg by mouth daily.     lisinopril (ZESTRIL) 30 MG tablet Take 30 mg by mouth daily.      pantoprazole (PROTONIX) 40 MG tablet Take 1 tablet (40 mg total) by mouth 2 (two) times daily before a meal. Take 30 minutes before breakfast and dinner. 60 tablet 6   nitroGLYCERIN (NITROSTAT) 0.4 MG SL tablet Place 1 tablet (0.4 mg total) under the tongue every 5 (five) minutes as needed for chest pain. 90 tablet 1   No current facility-administered medications for this visit.    Allergies  Allergen Reactions   Tape Other (See Comments)    Blisters Cordran surgical tape per MD H&P    Review of Systems:  neg     Physical Exam:    BP (!) 146/70   Pulse 67   Ht 5' (1.524 m)   Wt 140 lb 6 oz (63.7 kg)   SpO2 96%   BMI 27.42 kg/m  Wt Readings from Last 3 Encounters:  11/23/20 140 lb 6 oz (63.7 kg)  08/10/20 139 lb 14.4 oz (63.5 kg)  05/21/20 136 lb 2 oz (61.7 kg)   Constitutional:  Well-developed, in no acute distress. Psychiatric: Normal mood and affect. Behavior is normal. HEENT: Pupils normal.  Conjunctivae are normal. No scleral icterus. Neck supple.  Cardiovascular: Normal rate, regular rhythm. No edema Pulmonary/chest: Effort normal and breath sounds normal. No wheezing, rales or rhonchi. Abdominal: Soft, nondistended. Nontender. Bowel sounds active throughout. There are no masses palpable. No hepatomegaly.  Well-healed surgical  scars. Rectal:  defered Neurological: Alert and oriented to person place and time. Skin: Skin is warm and dry. No rashes noted.  Data Reviewed: I have personally reviewed following labs and imaging studies     Carmell Austria, MD 11/23/2020, 10:47 AM  Cc: Cher Nakai, MD

## 2020-12-01 DIAGNOSIS — E7800 Pure hypercholesterolemia, unspecified: Secondary | ICD-10-CM | POA: Diagnosis not present

## 2020-12-01 DIAGNOSIS — I10 Essential (primary) hypertension: Secondary | ICD-10-CM | POA: Diagnosis not present

## 2020-12-01 DIAGNOSIS — I2510 Atherosclerotic heart disease of native coronary artery without angina pectoris: Secondary | ICD-10-CM | POA: Diagnosis not present

## 2020-12-01 DIAGNOSIS — M8580 Other specified disorders of bone density and structure, unspecified site: Secondary | ICD-10-CM | POA: Diagnosis not present

## 2020-12-01 DIAGNOSIS — I739 Peripheral vascular disease, unspecified: Secondary | ICD-10-CM | POA: Diagnosis not present

## 2020-12-01 DIAGNOSIS — K219 Gastro-esophageal reflux disease without esophagitis: Secondary | ICD-10-CM | POA: Diagnosis not present

## 2020-12-01 DIAGNOSIS — M109 Gout, unspecified: Secondary | ICD-10-CM | POA: Diagnosis not present

## 2020-12-01 DIAGNOSIS — E1159 Type 2 diabetes mellitus with other circulatory complications: Secondary | ICD-10-CM | POA: Diagnosis not present

## 2020-12-01 DIAGNOSIS — D509 Iron deficiency anemia, unspecified: Secondary | ICD-10-CM | POA: Diagnosis not present

## 2020-12-01 DIAGNOSIS — R69 Illness, unspecified: Secondary | ICD-10-CM | POA: Diagnosis not present

## 2020-12-03 ENCOUNTER — Other Ambulatory Visit: Payer: Self-pay | Admitting: Interventional Radiology

## 2020-12-03 DIAGNOSIS — K559 Vascular disorder of intestine, unspecified: Secondary | ICD-10-CM

## 2020-12-11 DIAGNOSIS — K635 Polyp of colon: Secondary | ICD-10-CM | POA: Insufficient documentation

## 2020-12-11 DIAGNOSIS — K922 Gastrointestinal hemorrhage, unspecified: Secondary | ICD-10-CM | POA: Insufficient documentation

## 2020-12-14 ENCOUNTER — Other Ambulatory Visit: Payer: Self-pay

## 2020-12-14 NOTE — Progress Notes (Signed)
Cardiology Office Note:    Date:  12/15/2020   ID:  Marisa Callahan, DOB 1951-04-15, MRN [id number]  PCP:  Marisa Nakai, MD  Cardiologist:  Marisa More, MD    Referring MD: Marisa Nakai, MD    ASSESSMENT:    1. Coronary artery disease of native artery of native heart with stable angina pectoris ([place])   2. Essential hypertension   3. Hypercholesteremia   4. Left bundle branch block    PLAN:    In order of problems listed above:  Marisa Callahan continues to do well with her CAD having no angina after remote PCI and current medical therapy she will continue treatment including dual antiplatelet rate limiting calcium channel blocker and her combination high intensity statin and Zetia.  At this time I would not advise an ischemia evaluation Well-controlled continue current treatment including lisinopril Lipids are ideal with high risk CAD continue combination atorvastatin well-tolerated and Zetia Stable EKG pattern read normal AV node conduction   Next appointment: 1 year   Medication Adjustments/Labs and Tests Ordered: Current medicines are reviewed at length with the patient today.  Concerns regarding medicines are outlined above.  No orders of the defined types were placed in this encounter.  No orders of the defined types were placed in this encounter.   Chief Complaint  Patient presents with   Follow-up   Coronary Artery Disease     History of Present Illness:    Marisa Callahan is a 70 y.o. female with a hx of CAD with PCI in 1999 type 2 diabetes mellitus hypertension hyperlipidemia carotid surgery with open abdominal aortic aneurysm surgery and aortobifemoral bypass in 2011 ischemic colitis with GI bleed last seen 11/14/2019.  Compliance with diet, lifestyle and medications: Yes  She is doing quite well she had C. difficile colitis and iron deficiency anemia last fall has received IV iron and feels much improved.  She has no chest pain shortness of breath palpitation or syncope.  She  tolerates her statin without muscle pain or weakness.  Recent labs 12/01/2020 with a cholesterol 137 LDL 57 triglycerides 171 HDL 52 A1c 6.1% creatinine 0.78  She had a myocardial perfusion study 06/13/2017 EF 51% no ischemic ST changes showing a fixed defect present in the inferoseptal anteroseptal and apical septal regions consistent with infarction or left bundle branch block abnormality and a defect present in the basal to mid and apical inferior wall with ischemia of the right coronary artery distribution.  With her comorbidities including GI bleed medical therapy was preferred after discussion with the patient and she was restarted on dual antiplatelet treatment along with antianginal therapy and combined statin and Zetia.  Study Highlights    Nuclear stress EF: 51%. There was no ST segment deviation noted during stress. Defect 1: There is a large defect present in the basal anteroseptal, basal inferoseptal, mid anteroseptal, mid inferoseptal and apical septal location. Defect 2: There is a medium defect of moderate severity present in the basal inferior, mid inferior and apical inferior location. This is a high risk study. The left ventricular ejection fraction is mildly decreased (45-54%).   This is an abnormal study. There are two defects, a large fixed defect in the basal and mid inferoseptal, anteroseptal and apical septal walls consistent with a prior infarct but possibly related to LBBB. There is a medium size, mild severity, reversible defect in the basal, mid and apical inferior walls consistent with ischemia in the RCA territory.  Past Medical History:  Diagnosis Date  Abdominal pain 02/28/2017   Abnormal myocardial perfusion study 06/19/2017   Anemia 03/02/2017   Anxiety 02/28/2017   Arthritis 02/28/2017   Atherosclerosis of native artery of both lower extremities ([place]) 09/28/2015   Bilateral carotid artery stenosis 09/28/2015   Boil of buttock 02/28/2017   Overview:   right   Cancer [facility]) 1977   cervix    Colitis 02/28/2017   Colon polyp    Coronary artery disease 02/28/2017   Overview:  history of coronary stents   Coronary artery disease involving native coronary artery of native heart with angina pectoris ([place]) 09/28/2015   Dehydration 02/28/2017   Depression 02/28/2017   Diabetes mellitus ([place]) 02/28/2017   GERD (gastroesophageal reflux disease) 02/28/2017   GI bleed    History of AAA (abdominal aortic aneurysm) repair 12/03/2009   with [place]fem [place]   Hypercholesteremia 02/28/2017   Hypertension 02/28/2017   Hypotension 02/28/2017   [facility] 02/19/17   Incisional hernia, incarcerated 02/28/2017   Iron deficiency anemia due to chronic blood loss 05/14/2020   Left bundle branch block 02/28/2017   Legally blind in right eye, as defined in [place] 02/28/2017   MI (myocardial infarction) ([place]) 1999   Osteoarthritis of spine 02/28/2017   PVD (peripheral vascular disease) ([place]) 09/28/2015   Sinus tachycardia    Stroke ([place]) 02/14/2014   right eye stroke   UTI (urinary tract infection) 02/28/2017   Ventral hernia 09/28/2015    Past Surgical History:  Procedure Laterality Date   ABDOMINAL AORTIC ANEURYSM REPAIR     open   ABDOMINAL HERNIA REPAIR     with dehiscence and chronic wound   ABDOMINAL HYSTERECTOMY     BACK SURGERY     as a child   BYPASS GRAFT     CARDIAC CATHETERIZATION     CESAREAN SECTION     x2   CHOLECYSTECTOMY     COLONOSCOPY  02/12/2017   Ischemic colitis (involving distal transvere colon, descending colon and proximal sigmoid colon) Moderate sigmoid diverticulosis   HAND SURGERY Bilateral    repair   HERNIA REPAIR  2017   IR ANGIOGRAM VISCERAL SELECTIVE  05/06/2020   IR RADIOLOGIST EVAL & MGMT  04/15/2020   IR RADIOLOGIST EVAL & MGMT  07/01/2020   IR TRANSCATH PLC STENT 1ST ART NOT LE CV CAR VERT CAR  05/06/2020   IR US GUIDE VASC ACCESS RIGHT  05/06/2020   TRIGGER FINGER RELEASE     x2     Current Medications: Current Meds  Medication Sig   acetaminophen (TYLENOL) 500 MG tablet Take 1,000-1,500 mg by mouth every 8 (eight) hours as needed for moderate pain or headache.   aspirin EC 81 MG tablet Take 81 mg by mouth at bedtime.    atorvastatin (LIPITOR) 40 MG tablet Take 40 mg by mouth at bedtime.   Calcium Carb-Cholecalciferol (CALCIUM 600 + D PO) Take 1 tablet by mouth 2 (two) times daily.   citalopram (CELEXA) 20 MG tablet Take 20 mg by mouth daily.   clopidogrel (PLAVIX) 75 MG tablet Take 1 tablet (75 mg total) by mouth daily.   diclofenac Sodium (VOLTAREN) 1 % GEL Apply 1 application topically daily as needed for pain (pain).   diltiazem (CARDIZEM CD) 120 MG 24 hr capsule Take 1 capsule (120 mg total) by mouth daily.   ezetimibe (ZETIA) 10 MG tablet Take 10 mg by mouth daily.   folic acid (FOLVITE) 1 MG tablet Take 1 mg by mouth daily.  lisinopril (ZESTRIL) 30 MG tablet Take 30 mg by mouth daily.    nitroGLYCERIN (NITROSTAT) 0.4 MG SL tablet Place 0.4 mg under the tongue every 5 (five) minutes as needed for chest pain.   pantoprazole (PROTONIX) 40 MG tablet Take 40 mg by mouth daily.     Allergies:   Tape   Social History   Socioeconomic History   Marital status: Married    Spouse name: [name]   Number of children: 3   Years of education: Not on file   Highest education level: 10th grade  Occupational History   Not on file  Tobacco Use   Smoking status: Former    Pack years: 0.00    Types: Cigarettes    Quit date: 1999    Years since quitting: 23.5   Smokeless tobacco: Never  Vaping Use   Vaping Use: Never used  Substance and Sexual Activity   Alcohol use: No   Drug use: Yes    Frequency: 7.0 times per week    Types: Marijuana    Comment: last on 05/04/20   Sexual activity: Yes  Other Topics Concern   Not on file  Social History Narrative   Not on file   Social Determinants of Health   Financial Resource Strain: Not on file  Food  Insecurity: Not on file  Transportation Needs: Not on file  Physical Activity: Not on file  Stress: Not on file  Social Connections: Not on file     Family History: The patient's family history includes Cerebral aneurysm in her father; Congestive Heart Failure in her mother and sister; Depression in her sister; Diabetes in her brother, mother, sister, and sister; Hypertension in her mother and sister; Kidney disease in her sister; Stroke in her mother. There is no history of Colon cancer, Colon polyps, Esophageal cancer, Rectal cancer, or Stomach cancer. ROS:   Please see the history of present illness.    All other systems reviewed and are negative.  EKGs/Labs/Other Studies Reviewed:    The following studies were reviewed today:  EKG:  EKG ordered today and personally reviewed.  The ekg ordered today demonstrates sinus rhythm left bundle branch block  Recent Labs: 04/01/2020: TSH 0.98 05/12/2020: ALT 19; BUN 9; Creatinine 0.6; Potassium 3.8; Sodium 139 08/10/2020: Hemoglobin 13.9; Platelets 231    Physical Exam:    VS:  BP 138/62 (BP Location: Right Arm, Patient Position: Sitting, Cuff Size: Normal)   Pulse 61   Ht 5' (1.524 m)   Wt 139 lb 9.6 oz (63.3 kg)   SpO2 98%   BMI 27.26 kg/m     Wt Readings from Last 3 Encounters:  12/15/20 139 lb 9.6 oz (63.3 kg)  11/23/20 140 lb 6 oz (63.7 kg)  08/10/20 139 lb 14.4 oz (63.5 kg)     GEN:  Well nourished, well developed in no acute distress HEENT: Normal NECK: No JVD; No carotid bruits LYMPHATICS: No lymphadenopathy CARDIAC: Paradoxical second heart sound RRR, no murmurs, rubs, gallops RESPIRATORY:  Clear to auscultation without rales, wheezing or rhonchi  ABDOMEN: Soft, non-tender, non-distended MUSCULOSKELETAL:  No edema; No deformity  SKIN: Warm and dry NEUROLOGIC:  Alert and oriented x 3 PSYCHIATRIC:  Normal affect    Signed, Marisa More, MD  12/15/2020 8:47 AM    [facility]

## 2020-12-15 ENCOUNTER — Encounter: Payer: Self-pay | Admitting: Cardiology

## 2020-12-15 ENCOUNTER — Ambulatory Visit: Payer: Medicare HMO | Admitting: Cardiology

## 2020-12-15 ENCOUNTER — Other Ambulatory Visit: Payer: Self-pay

## 2020-12-15 VITALS — BP 138/62 | HR 61 | Ht 60.0 in | Wt 139.6 lb

## 2020-12-15 DIAGNOSIS — E7800 Pure hypercholesterolemia, unspecified: Secondary | ICD-10-CM

## 2020-12-15 DIAGNOSIS — I10 Essential (primary) hypertension: Secondary | ICD-10-CM

## 2020-12-15 DIAGNOSIS — I25118 Atherosclerotic heart disease of native coronary artery with other forms of angina pectoris: Secondary | ICD-10-CM | POA: Diagnosis not present

## 2020-12-15 DIAGNOSIS — I447 Left bundle-branch block, unspecified: Secondary | ICD-10-CM

## 2020-12-15 NOTE — Patient Instructions (Signed)
Medication Instructions:  No medication changes. *If you need a refill on your cardiac medications before your next appointment, please call your pharmacy*   Lab Work: None ordered If you have labs (blood work) drawn today and your tests are completely normal, you will receive your results only by: . MyChart Message (if you have MyChart) OR . A paper copy in the mail If you have any lab test that is abnormal or we need to change your treatment, we will call you to review the results.   Testing/Procedures: None ordered   Follow-Up: At [facility], you and your health needs are our priority.  As part of our continuing mission to provide you with exceptional heart care, we have created designated Provider Care Teams.  These Care Teams include your primary Cardiologist (physician) and Advanced Practice Providers (APPs -  Physician Assistants and Nurse Practitioners) who all work together to provide you with the care you need, when you need it.  We recommend signing up for the patient portal called "MyChart".  Sign up information is provided on this After Visit Summary.  MyChart is used to connect with patients for Virtual Visits (Telemedicine).  Patients are able to view lab/test results, encounter notes, upcoming appointments, etc.  Non-urgent messages can be sent to your provider as well.   To learn more about what you can do with MyChart, go to [email or web address].    Your next appointment:   12 month(s)  The format for your next appointment:   In Person  Provider:   Brian Munley, MD   Other Instructions NA  

## 2020-12-16 ENCOUNTER — Encounter: Payer: Self-pay | Admitting: *Deleted

## 2020-12-16 ENCOUNTER — Ambulatory Visit
Admission: RE | Admit: 2020-12-16 | Discharge: 2020-12-16 | Disposition: A | Discharge status: Home / Self Care | Payer: Medicare HMO | Source: Ambulatory Visit | Attending: Interventional Radiology | Admitting: Interventional Radiology

## 2020-12-16 DIAGNOSIS — K559 Vascular disorder of intestine, unspecified: Secondary | ICD-10-CM

## 2020-12-16 DIAGNOSIS — K551 Chronic vascular disorders of intestine: Secondary | ICD-10-CM | POA: Diagnosis not present

## 2020-12-16 DIAGNOSIS — Z9889 Other specified postprocedural states: Secondary | ICD-10-CM | POA: Diagnosis not present

## 2020-12-16 HISTORY — PX: IR RADIOLOGIST EVAL & MGMT: IMG5224

## 2020-12-16 NOTE — Progress Notes (Signed)
Chief Complaint: Mesenteric Artery Disease   Referring Physician(s): Dr. Jackquline Denmark   History of Present Illness: Marisa Callahan is a 70 y.o. 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   Marisa Callahan joins [place] today by way of virtual visit, given the COVID crisis.  We confirmed her identity with 2 personal identifiers.  History:  Marisa Callahan was referred to [place] with symptoms of CMI, with weight-loss, discomfort with meals, and multiple episodes of self-limited ischemic colitis as well as Cdiff colitis.  She was seen in our clinic 04/15/20 by my partner Dr. Vernard Gambles.   We treated Marisa Callahan on 05/06/20 with right CFA access, mesenteric angiogram, and treatment of critical celiac artery stenosis with bare metal balloon mounted stents (overlapping [place] Blue). 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  She also has prior IMA ligation secondary to open AAA repair.     Interval History: Today, Marisa Callahan tells me that she is "wonderful." She is very satisfied with her treatment, She is eating with comfort and no post-prandial pain.  She denies nausea, vomiting, hematemesis, melena, hematochezia.    She continues to see her cardiologist Dr. Bettina Gavia, and continues on dual anti-platelet medication.   She receives occasional iron supplementation by Dr. Bobby Rumpf.    Past Medical History:  Diagnosis Date   Abdominal pain 02/28/2017   Abnormal myocardial perfusion study 06/19/2017   Anemia 03/02/2017   Anxiety 02/28/2017   Arthritis 02/28/2017   Atherosclerosis of native artery of both lower extremities ([place]) 09/28/2015   Bilateral carotid artery stenosis 09/28/2015   Boil of buttock 02/28/2017   Overview:  right   Cancer ([place]) 1977   cervix    Colitis 02/28/2017   Colon polyp    Coronary artery disease 02/28/2017   Overview:   history of coronary stents   Coronary artery disease involving native coronary artery of native heart with angina pectoris ([place]) 09/28/2015   Dehydration 02/28/2017   Depression 02/28/2017   Diabetes mellitus ([place]) 02/28/2017   GERD (gastroesophageal reflux disease) 02/28/2017   GI bleed    History of AAA (abdominal aortic aneurysm) repair 12/03/2009   with [place]   Hypercholesteremia 02/28/2017   Hypertension 02/28/2017   Hypotension 02/28/2017   [facility] 02/19/17   Incisional hernia, incarcerated 02/28/2017   Iron deficiency anemia due to chronic blood loss 05/14/2020   Left bundle branch block 02/28/2017   Legally blind in right eye, as defined in [place] 02/28/2017   MI (myocardial infarction) ([place]) 1999   Osteoarthritis of spine 02/28/2017   PVD (peripheral vascular disease) ([place]) 09/28/2015   Sinus tachycardia    Stroke ([place]) 02/14/2014   right eye stroke   UTI (urinary tract infection) 02/28/2017   Ventral hernia 09/28/2015    Past Surgical History:  Procedure Laterality Date   ABDOMINAL AORTIC ANEURYSM REPAIR     open   ABDOMINAL HERNIA REPAIR     with dehiscence and chronic wound   ABDOMINAL HYSTERECTOMY     BACK SURGERY     as a child   BYPASS GRAFT     CARDIAC CATHETERIZATION     CESAREAN SECTION     x2   CHOLECYSTECTOMY     COLONOSCOPY  02/12/2017   Ischemic colitis (involving distal transvere colon, descending colon and proximal sigmoid colon) Moderate  sigmoid diverticulosis   HAND SURGERY Bilateral    repair   HERNIA REPAIR  2017   IR ANGIOGRAM VISCERAL SELECTIVE  05/06/2020   IR RADIOLOGIST EVAL & MGMT  04/15/2020   IR RADIOLOGIST EVAL & MGMT  07/01/2020   IR TRANSCATH PLC STENT 1ST ART NOT LE CV CAR VERT CAR  05/06/2020   IR US GUIDE VASC ACCESS RIGHT  05/06/2020   TRIGGER FINGER RELEASE     x2    Allergies: Tape  Medications: Prior to Admission medications   Medication Sig Start Date End Date Taking? Authorizing  Provider  acetaminophen (TYLENOL) 500 MG tablet Take 1,000-1,500 mg by mouth every 8 (eight) hours as needed for moderate pain or headache.    [provider]  aspirin EC 81 MG tablet Take 81 mg by mouth at bedtime.     [provider]  atorvastatin (LIPITOR) 40 MG tablet Take 40 mg by mouth at bedtime.    [provider]  Calcium Carb-Cholecalciferol (CALCIUM 600 + D PO) Take 1 tablet by mouth 2 (two) times daily.    [provider]  citalopram (CELEXA) 20 MG tablet Take 20 mg by mouth daily.    [provider]  clopidogrel (PLAVIX) 75 MG tablet Take 1 tablet (75 mg total) by mouth daily. 07/08/20   Richardo Priest, MD  diclofenac Sodium (VOLTAREN) 1 % GEL Apply 1 application topically daily as needed for pain (pain).    [provider]  diltiazem (CARDIZEM CD) 120 MG 24 hr capsule Take 1 capsule (120 mg total) by mouth daily. 07/07/20   Richardo Priest, MD  ezetimibe (ZETIA) 10 MG tablet Take 10 mg by mouth daily.    [provider]  folic acid (FOLVITE) 1 MG tablet Take 1 mg by mouth daily.    [provider]  lisinopril (ZESTRIL) 30 MG tablet Take 30 mg by mouth daily.  10/07/19   [provider]  nitroGLYCERIN (NITROSTAT) 0.4 MG SL tablet Place 0.4 mg under the tongue every 5 (five) minutes as needed for chest pain.    [provider]  pantoprazole (PROTONIX) 40 MG tablet Take 40 mg by mouth daily.    [provider]     Family History  Problem Relation Age of Onset   Hypertension Mother    Diabetes Mother    Stroke Mother    Congestive Heart Failure Mother    Hypertension Sister    Diabetes Sister    Congestive Heart Failure Sister    Depression Sister    Diabetes Brother    Cerebral aneurysm Father    Diabetes Sister    Kidney disease Sister    Colon cancer Neg Hx    Colon polyps Neg Hx    Esophageal cancer Neg Hx    Rectal cancer Neg Hx    Stomach cancer Neg Hx     Social History    Socioeconomic History   Marital status: Married    Spouse name: Marisa Callahan   Number of children: 3   Years of education: Not on file   Highest education level: 10th grade  Occupational History   Not on file  Tobacco Use   Smoking status: Former    Pack years: 0.00    Types: Cigarettes    Quit date: 1999    Years since quitting: 23.5   Smokeless tobacco: Never  Vaping Use   Vaping Use: Never used  Substance and Sexual Activity   Alcohol use:  No   Drug use: Yes    Frequency: 7.0 times per week    Types: Marijuana    Comment: last on 05/04/20   Sexual activity: Yes  Other Topics Concern   Not on file  Social History Narrative   Not on file   Social Determinants of Health   Financial Resource Strain: Not on file  Food Insecurity: Not on file  Transportation Needs: Not on file  Physical Activity: Not on file  Stress: Not on file  Social Connections: Not on file       Review of Systems  Review of Systems: A 12 point ROS discussed and pertinent positives are indicated in the HPI above.  All other systems are negative.  Physical Exam No direct physical exam was performed (except for noted visual exam findings with Video Visits).   Vital Signs: There were no vitals taken for this visit.  Imaging: No results found.  Labs:  CBC: Recent Labs    04/01/20 1051 05/06/20 0646 05/12/20 0000 08/10/20 0000  WBC 8.3 11.4* 8.2 7.0  HGB 9.7* 10.5* 9.2* 13.9  HCT 31.0* 36.6 30* 42  PLT 345.0 499* 402* 231    COAGS: Recent Labs    05/06/20 0646  INR 1.0    BMP: Recent Labs    04/01/20 1051 05/06/20 0646 05/12/20 0000  NA 139 133* 139  K 4.6 3.3* 3.8  CL 104 98 104  CO2 26 20* 26*  GLUCOSE 103* 156*  --   BUN 8 7* 9  CALCIUM 9.7 9.7 9.9  CREATININE 0.70 1.04* 0.6  GFRNONAA  --  59*  --     LIVER FUNCTION TESTS: Recent Labs    04/01/20 1051 05/12/20 0000  BILITOT 0.3  --   AST 9 32  ALT 7 19  ALKPHOS 70 82  PROT 6.6  --   ALBUMIN 4.2 4.1     TUMOR MARKERS: No results for input(s): AFPTM, CEA, CA199, CHROMGRNA in the last 8760 hours.  Assessment and Plan:  Marisa Callahan is a very pleasant 70 year old female SP treatment for chronic mesenteric ischemia, 05/06/20 with balloon expandable stenting of celiac artery.     She tells me she is "wonderful" and has no symptoms.     She continues on dual anti-platelet medication, which I agree with for her pattern of disease and additional PAD/CAD history. .     We will schedule her for follow up in 12 months, and she agrees. She also knows that she should seek emergent medical attention should she have any new acute abdominal pain or concerns.   Plan: - 12 month follow up visit, with no imaging required.  She knows that she may follow up with [place] at any time should she have any problems. - agree with dual anti-platelet treatment.    - Maximal medical therapy    Electronically Signed: Corrie Mckusick 12/16/2020, 12:10 PM   I spent a total of    15 Minutes in remote  clinical consultation, greater than 50% of which was counseling/coordinating care for chronic mesenteric disease, SP treatment with balloon expandable stenting.    Visit type: Audio only (telephone). Audio (no video) only due to patient's lack of internet/smartphone capability. Alternative for in-person consultation at [facility], [address], [place], [place]. This visit type was conducted due to national recommendations for restrictions regarding the COVID-19 Pandemic (e.g. social distancing).  This format is felt to be most appropriate for this patient at  this time.  All issues noted in this document were discussed and addressed.

## 2020-12-23 DIAGNOSIS — H2513 Age-related nuclear cataract, bilateral: Secondary | ICD-10-CM | POA: Diagnosis not present

## 2021-01-14 DIAGNOSIS — Z1231 Encounter for screening mammogram for malignant neoplasm of breast: Secondary | ICD-10-CM | POA: Diagnosis not present

## 2021-01-26 DIAGNOSIS — E785 Hyperlipidemia, unspecified: Secondary | ICD-10-CM | POA: Diagnosis not present

## 2021-01-26 DIAGNOSIS — Z1331 Encounter for screening for depression: Secondary | ICD-10-CM | POA: Diagnosis not present

## 2021-01-26 DIAGNOSIS — Z9181 History of falling: Secondary | ICD-10-CM | POA: Diagnosis not present

## 2021-01-26 DIAGNOSIS — Z0000 Encounter for general adult medical examination without abnormal findings: Secondary | ICD-10-CM | POA: Diagnosis not present

## 2021-02-02 NOTE — Progress Notes (Signed)
[facility]  [address] [place],  [place]  [address] [phone]  Clinic Day:  02/10/2021  Referring physician: Cher Nakai, MD  This document serves as a record of services personally performed by Zyana Amaro Macarthur Critchley, MD. It was created on their behalf by [facility] E, a trained medical scribe. The creation of this record is based on the scribe's personal observations and the provider's statements to them.  HISTORY OF PRESENT ILLNESS:  The patient is a 70 y.o. female  who I recently began seeing for iron deficiency anemia.  She comes in today to reassess her iron and hemoglobin levels.  Since her last visit, the patient has been doing well.  She continues to deny having any overt forms of blood loss to explain her past iron deficiency anemia.  PHYSICAL EXAM:  Blood pressure (!) 181/84, pulse 71, temperature 99 F (37.2 C), resp. rate 16, height 5' (1.524 m), weight 143 lb 6.4 oz (65 kg), SpO2 96 %. Wt Readings from Last 3 Encounters:  02/10/21 143 lb 6.4 oz (65 kg)  12/15/20 139 lb 9.6 oz (63.3 kg)  11/23/20 140 lb 6 oz (63.7 kg)   Body mass index is 28.01 kg/m. Performance status (ECOG): 1 - Symptomatic but completely ambulatory Physical Exam Constitutional:      Appearance: Normal appearance. She is not ill-appearing.  HENT:     Mouth/Throat:     Mouth: Mucous membranes are moist.     Pharynx: Oropharynx is clear. No oropharyngeal exudate or posterior oropharyngeal erythema.  Cardiovascular:     Rate and Rhythm: Normal rate and regular rhythm.     Heart sounds: No murmur heard.   No friction rub. No gallop.  Pulmonary:     Effort: Pulmonary effort is normal. No respiratory distress.     Breath sounds: Normal breath sounds. No wheezing, rhonchi or rales.  Abdominal:     General: Bowel sounds are normal. There is no distension.     Palpations: Abdomen is soft. There is no mass.     Tenderness: There is no abdominal tenderness.   Musculoskeletal:        General: No swelling.     Right lower leg: No edema.     Left lower leg: No edema.  Lymphadenopathy:     Cervical: No cervical adenopathy.     Upper Body:     Right upper body: No supraclavicular or axillary adenopathy.     Left upper body: No supraclavicular or axillary adenopathy.     Lower Body: No right inguinal adenopathy. No left inguinal adenopathy.  Skin:    General: Skin is warm.     Coloration: Skin is not jaundiced.     Findings: No lesion or rash.  Neurological:     General: No focal deficit present.     Mental Status: She is alert and oriented to person, place, and time. Mental status is at baseline.     Cranial Nerves: Cranial nerves are intact.  Psychiatric:        Mood and Affect: Mood normal.        Behavior: Behavior normal.        Thought Content: Thought content normal.   LABS:   CBC Latest Ref Rng & Units 02/10/2021 08/10/2020 05/12/2020  WBC - 7.1 7.0 8.2  Hemoglobin 12.0 - 16.0 14.1 13.9 9.2(A)  Hematocrit 36 - 46 43 42 30(A)  Platelets 150 - 399 270 231 402(A)    Iron studies pending  ASSESSMENT & PLAN:  A 70 y.o. female with iron deficiency anemia.  Based upon her labs today, her hemoglobin remains at an ideal level.   Clinically, the patient is doing well.  I will see her back in 6 months for repeat clinical assessment.  If her iron and hemoglobin levels remain normal, I will turn her care back over to her other physicians after her next visit.  The patient understands all the plans discussed today and is in agreement with them.   I, Rita Ohara, am acting as scribe for Marice Potter, MD    I have reviewed this report as typed by the medical scribe, and it is complete and accurate.  Mica Ramdass Macarthur Critchley, MD If her

## 2021-02-10 ENCOUNTER — Other Ambulatory Visit: Payer: Self-pay | Admitting: Hematology and Oncology

## 2021-02-10 ENCOUNTER — Inpatient Hospital Stay: Payer: Medicare HMO | Attending: Oncology

## 2021-02-10 ENCOUNTER — Inpatient Hospital Stay: Payer: Medicare HMO | Admitting: Oncology

## 2021-02-10 ENCOUNTER — Other Ambulatory Visit: Payer: Self-pay | Admitting: Oncology

## 2021-02-10 VITALS — BP 181/84 | HR 71 | Temp 99.0°F | Resp 16 | Ht 60.0 in | Wt 143.4 lb

## 2021-02-10 DIAGNOSIS — D500 Iron deficiency anemia secondary to blood loss (chronic): Secondary | ICD-10-CM | POA: Diagnosis not present

## 2021-02-10 DIAGNOSIS — D509 Iron deficiency anemia, unspecified: Secondary | ICD-10-CM | POA: Insufficient documentation

## 2021-02-10 DIAGNOSIS — D649 Anemia, unspecified: Secondary | ICD-10-CM | POA: Diagnosis not present

## 2021-02-10 LAB — IRON AND TIBC
Iron: 99 ug/dL (ref 28–170)
Saturation Ratios: 23 % (ref 10.4–31.8)
TIBC: 429 ug/dL (ref 250–450)
UIBC: 330 ug/dL

## 2021-02-10 LAB — CBC AND DIFFERENTIAL
HCT: 43 (ref 36–46)
Hemoglobin: 14.1 (ref 12.0–16.0)
Neutrophils Absolute: 4.26
Platelets: 270 (ref 150–399)
WBC: 7.1

## 2021-02-10 LAB — FERRITIN: Ferritin: 27 ng/mL (ref 11–307)

## 2021-02-10 LAB — COMPREHENSIVE METABOLIC PANEL
Albumin: 4.2 (ref 3.5–5.0)
Calcium: 9.4 (ref 8.7–10.7)

## 2021-02-10 LAB — BASIC METABOLIC PANEL
BUN: 10 (ref 4–21)
CO2: 22 (ref 13–22)
Chloride: 101 (ref 99–108)
Creatinine: 0.7 (ref 0.5–1.1)
Glucose: 113
Potassium: 4.4 (ref 3.4–5.3)
Sodium: 133 — AB (ref 137–147)

## 2021-02-10 LAB — HEPATIC FUNCTION PANEL
ALT: 21 (ref 7–35)
AST: 28 (ref 13–35)
Alkaline Phosphatase: 65 (ref 25–125)
Bilirubin, Total: 0.5

## 2021-02-10 LAB — CBC: RBC: 4.56 (ref 3.87–5.11)

## 2021-02-11 DIAGNOSIS — I10 Essential (primary) hypertension: Secondary | ICD-10-CM | POA: Insufficient documentation

## 2021-02-11 DIAGNOSIS — E782 Mixed hyperlipidemia: Secondary | ICD-10-CM | POA: Diagnosis not present

## 2021-02-11 DIAGNOSIS — I6523 Occlusion and stenosis of bilateral carotid arteries: Secondary | ICD-10-CM | POA: Diagnosis not present

## 2021-02-11 DIAGNOSIS — I739 Peripheral vascular disease, unspecified: Secondary | ICD-10-CM | POA: Diagnosis not present

## 2021-03-03 DIAGNOSIS — M109 Gout, unspecified: Secondary | ICD-10-CM | POA: Diagnosis not present

## 2021-03-03 DIAGNOSIS — I2510 Atherosclerotic heart disease of native coronary artery without angina pectoris: Secondary | ICD-10-CM | POA: Diagnosis not present

## 2021-03-03 DIAGNOSIS — I10 Essential (primary) hypertension: Secondary | ICD-10-CM | POA: Diagnosis not present

## 2021-03-03 DIAGNOSIS — E1159 Type 2 diabetes mellitus with other circulatory complications: Secondary | ICD-10-CM | POA: Diagnosis not present

## 2021-03-03 DIAGNOSIS — M8580 Other specified disorders of bone density and structure, unspecified site: Secondary | ICD-10-CM | POA: Diagnosis not present

## 2021-03-03 DIAGNOSIS — I739 Peripheral vascular disease, unspecified: Secondary | ICD-10-CM | POA: Diagnosis not present

## 2021-03-03 DIAGNOSIS — R69 Illness, unspecified: Secondary | ICD-10-CM | POA: Diagnosis not present

## 2021-03-03 DIAGNOSIS — Z6828 Body mass index (BMI) 28.0-28.9, adult: Secondary | ICD-10-CM | POA: Diagnosis not present

## 2021-03-03 DIAGNOSIS — E7800 Pure hypercholesterolemia, unspecified: Secondary | ICD-10-CM | POA: Diagnosis not present

## 2021-03-03 DIAGNOSIS — D509 Iron deficiency anemia, unspecified: Secondary | ICD-10-CM | POA: Diagnosis not present

## 2021-04-13 DIAGNOSIS — R69 Illness, unspecified: Secondary | ICD-10-CM | POA: Diagnosis not present

## 2021-04-13 DIAGNOSIS — I2510 Atherosclerotic heart disease of native coronary artery without angina pectoris: Secondary | ICD-10-CM | POA: Diagnosis not present

## 2021-04-13 DIAGNOSIS — K219 Gastro-esophageal reflux disease without esophagitis: Secondary | ICD-10-CM | POA: Diagnosis not present

## 2021-04-13 DIAGNOSIS — E7800 Pure hypercholesterolemia, unspecified: Secondary | ICD-10-CM | POA: Diagnosis not present

## 2021-04-13 DIAGNOSIS — I739 Peripheral vascular disease, unspecified: Secondary | ICD-10-CM | POA: Diagnosis not present

## 2021-04-13 DIAGNOSIS — M7701 Medial epicondylitis, right elbow: Secondary | ICD-10-CM | POA: Diagnosis not present

## 2021-04-13 DIAGNOSIS — M109 Gout, unspecified: Secondary | ICD-10-CM | POA: Diagnosis not present

## 2021-04-13 DIAGNOSIS — M8580 Other specified disorders of bone density and structure, unspecified site: Secondary | ICD-10-CM | POA: Diagnosis not present

## 2021-04-13 DIAGNOSIS — D509 Iron deficiency anemia, unspecified: Secondary | ICD-10-CM | POA: Diagnosis not present

## 2021-05-25 DIAGNOSIS — J069 Acute upper respiratory infection, unspecified: Secondary | ICD-10-CM | POA: Diagnosis not present

## 2021-05-25 DIAGNOSIS — K219 Gastro-esophageal reflux disease without esophagitis: Secondary | ICD-10-CM | POA: Diagnosis not present

## 2021-05-25 DIAGNOSIS — R69 Illness, unspecified: Secondary | ICD-10-CM | POA: Diagnosis not present

## 2021-05-25 DIAGNOSIS — M8580 Other specified disorders of bone density and structure, unspecified site: Secondary | ICD-10-CM | POA: Diagnosis not present

## 2021-05-25 DIAGNOSIS — M109 Gout, unspecified: Secondary | ICD-10-CM | POA: Diagnosis not present

## 2021-05-25 DIAGNOSIS — I739 Peripheral vascular disease, unspecified: Secondary | ICD-10-CM | POA: Diagnosis not present

## 2021-05-25 DIAGNOSIS — I2510 Atherosclerotic heart disease of native coronary artery without angina pectoris: Secondary | ICD-10-CM | POA: Diagnosis not present

## 2021-05-25 DIAGNOSIS — E7800 Pure hypercholesterolemia, unspecified: Secondary | ICD-10-CM | POA: Diagnosis not present

## 2021-05-25 DIAGNOSIS — D509 Iron deficiency anemia, unspecified: Secondary | ICD-10-CM | POA: Diagnosis not present

## 2021-05-27 ENCOUNTER — Telehealth: Payer: Self-pay | Admitting: Cardiology

## 2021-05-27 MED ORDER — NITROGLYCERIN 0.4 MG SL SUBL: 0.4000 mg | SUBLINGUAL_TABLET | SUBLINGUAL | 6 refills | Status: DC | PRN

## 2021-05-27 NOTE — Telephone Encounter (Signed)
Refill of Nitroglycerin 0.4 mg sent to [facility].

## 2021-05-27 NOTE — Telephone Encounter (Signed)
° °*  STAT* If patient is at the pharmacy, call can be transferred to refill team.   1. Which medications need to be refilled? (please list name of each medication and dose if known) nitroGLYCERIN (NITROSTAT) 0.4 MG SL tablet  2. Which pharmacy/location (including street and city if local pharmacy) is medication to be sent to? [place], [place]  3. Do they need a 30 day or 90 day supply? 1 bottle

## 2021-06-02 DIAGNOSIS — I739 Peripheral vascular disease, unspecified: Secondary | ICD-10-CM | POA: Diagnosis not present

## 2021-06-02 DIAGNOSIS — R69 Illness, unspecified: Secondary | ICD-10-CM | POA: Diagnosis not present

## 2021-06-02 DIAGNOSIS — I10 Essential (primary) hypertension: Secondary | ICD-10-CM | POA: Diagnosis not present

## 2021-06-02 DIAGNOSIS — D509 Iron deficiency anemia, unspecified: Secondary | ICD-10-CM | POA: Diagnosis not present

## 2021-06-02 DIAGNOSIS — M109 Gout, unspecified: Secondary | ICD-10-CM | POA: Diagnosis not present

## 2021-06-02 DIAGNOSIS — K219 Gastro-esophageal reflux disease without esophagitis: Secondary | ICD-10-CM | POA: Diagnosis not present

## 2021-06-02 DIAGNOSIS — I2510 Atherosclerotic heart disease of native coronary artery without angina pectoris: Secondary | ICD-10-CM | POA: Diagnosis not present

## 2021-06-02 DIAGNOSIS — M8580 Other specified disorders of bone density and structure, unspecified site: Secondary | ICD-10-CM | POA: Diagnosis not present

## 2021-06-02 DIAGNOSIS — E7800 Pure hypercholesterolemia, unspecified: Secondary | ICD-10-CM | POA: Diagnosis not present

## 2021-06-02 DIAGNOSIS — E1159 Type 2 diabetes mellitus with other circulatory complications: Secondary | ICD-10-CM | POA: Diagnosis not present

## 2021-08-04 NOTE — Progress Notes (Signed)
?[facility]  ?[address] ?[place],    [address] ?[phone] ? ?Clinic Day:  08/10/2020 ? ?Referring physician: Cher Nakai, MD ? ?This document serves as a record of services personally performed by Marice Potter, MD. It was created on their behalf by Curry,Lauren E, a trained medical scribe. The creation of this record is based on the scribe's personal observations and the provider's statements to them. ? ?HISTORY OF PRESENT ILLNESS:  ?The patient is a 71 y.o. female  who I recently began seeing for iron deficiency anemia.  She comes in today to reassess her iron and hemoglobin levels.  Since her last visit, the patient has been doing well.  She continues to deny having any overt forms of blood loss to explain her past iron deficiency anemia. ? ?PHYSICAL EXAM:  ?Blood pressure (!) 175/79, pulse 69, temperature 98.4 ?F (36.9 ?C), resp. rate 14, height 5' (1.524 m), weight 147 lb (66.7 kg), SpO2 98 %. ?Wt Readings from Last 3 Encounters:  ?08/10/21 147 lb (66.7 kg)  ?02/10/21 143 lb 6.4 oz (65 kg)  ?12/15/20 139 lb 9.6 oz (63.3 kg)  ? ?Body mass index is 28.71 kg/m?Marland Kitchen ?Performance status (ECOG): 1 - Symptomatic but completely ambulatory ?Physical Exam ?Constitutional:   ?   Appearance: Normal appearance. She is not ill-appearing.  ?HENT:  ?   Mouth/Throat:  ?   Mouth: Mucous membranes are moist.  ?   Pharynx: Oropharynx is clear. No oropharyngeal exudate or posterior oropharyngeal erythema.  ?Cardiovascular:  ?   Rate and Rhythm: Normal rate and regular rhythm.  ?   Heart sounds: No murmur heard. ?  No friction rub. No gallop.  ?Pulmonary:  ?   Effort: Pulmonary effort is normal. No respiratory distress.  ?   Breath sounds: Normal breath sounds. No wheezing, rhonchi or rales.  ?Abdominal:  ?   General: Bowel sounds are normal. There is no distension.  ?   Palpations: Abdomen is soft. There is no mass.  ?   Tenderness: There is no abdominal tenderness.  ?Musculoskeletal:      ?   General: No swelling.  ?   Right lower leg: No edema.  ?   Left lower leg: No edema.  ?Lymphadenopathy:  ?   Cervical: No cervical adenopathy.  ?   Upper Body:  ?   Right upper body: No supraclavicular or axillary adenopathy.  ?   Left upper body: No supraclavicular or axillary adenopathy.  ?   Lower Body: No right inguinal adenopathy. No left inguinal adenopathy.  ?Skin: ?   General: Skin is warm.  ?   Coloration: Skin is not jaundiced.  ?   Findings: No lesion or rash.  ?Neurological:  ?   General: No focal deficit present.  ?   Mental Status: She is alert and oriented to person, place, and time. Mental status is at baseline.  ?Psychiatric:     ?   Mood and Affect: Mood normal.     ?   Behavior: Behavior normal.     ?   Thought Content: Thought content normal.  ? ?LABS:  ? ?CBC Latest Ref Rng & Units 08/10/2021 02/10/2021 08/10/2020  ?WBC - 6.1 7.1 7.0  ?Hemoglobin 12.0 - 16.0 13.3 14.1 13.9  ?Hematocrit 36 - 46 41 43 42  ?Platelets 150 - 399 244 270 231  ? ? Latest Reference Range & Units 08/10/21 09:15  ?Iron 28 - 170 ug/dL 60  ?UIBC ug/dL  382  ?TIBC 250 - 450 ug/dL 442  ?Saturation Ratios 10.4 - 31.8 % 14  ?Ferritin 11 - 307 ng/mL 22  ? ?ASSESSMENT & PLAN:  ?A 71 y.o. female with iron deficiency anemia.  Based upon her labs today, her hemoglobin and iron parameters remain at an ideal levels.   Clinically, the patient is doing well.   I do feel comfortable turning her care back over to her primary care office, with the recommendation that her CBC and iron panel be checked at least 1-2 times per year.  I would not have a problem seeing her in the future if new hematologic/oncologic issues arise that require repeat clinical assessment.  The patient understands all the plans discussed today and is in agreement with them. ? ? ?I, Rita Ohara, am acting as scribe for Marice Potter, MD   ? ?I have reviewed this report as typed by the medical scribe, and it is complete and accurate. ? ?Aspen Deterding Macarthur Critchley, MD ?If  her  ? ? ?  ?

## 2021-08-10 ENCOUNTER — Other Ambulatory Visit: Payer: Medicare HMO

## 2021-08-10 ENCOUNTER — Inpatient Hospital Stay: Payer: Medicare HMO | Admitting: Oncology

## 2021-08-10 ENCOUNTER — Other Ambulatory Visit: Payer: Self-pay

## 2021-08-10 ENCOUNTER — Ambulatory Visit: Payer: Medicare HMO | Admitting: Oncology

## 2021-08-10 ENCOUNTER — Encounter: Payer: Self-pay | Admitting: Oncology

## 2021-08-10 ENCOUNTER — Inpatient Hospital Stay: Payer: Medicare HMO | Attending: Oncology

## 2021-08-10 VITALS — BP 175/79 | HR 69 | Temp 98.4°F | Resp 14 | Ht 60.0 in | Wt 147.0 lb

## 2021-08-10 DIAGNOSIS — D500 Iron deficiency anemia secondary to blood loss (chronic): Secondary | ICD-10-CM

## 2021-08-10 DIAGNOSIS — D509 Iron deficiency anemia, unspecified: Secondary | ICD-10-CM | POA: Insufficient documentation

## 2021-08-10 LAB — HEPATIC FUNCTION PANEL
ALT: 21 (ref 7–35)
AST: 23 (ref 13–35)
Alkaline Phosphatase: 64 (ref 25–125)
Bilirubin, Total: 0.5

## 2021-08-10 LAB — IRON AND TIBC
Iron: 60 ug/dL (ref 28–170)
Saturation Ratios: 14 % (ref 10.4–31.8)
TIBC: 442 ug/dL (ref 250–450)
UIBC: 382 ug/dL

## 2021-08-10 LAB — BASIC METABOLIC PANEL
BUN: 9 (ref 4–21)
CO2: 29 — AB (ref 13–22)
Chloride: 104 (ref 99–108)
Creatinine: 0.7 (ref 0.5–1.1)
Glucose: 105
Potassium: 4.4 (ref 3.4–5.3)
Sodium: 138 (ref 137–147)

## 2021-08-10 LAB — FERRITIN: Ferritin: 22 ng/mL (ref 11–307)

## 2021-08-10 LAB — CBC AND DIFFERENTIAL
HCT: 41 (ref 36–46)
Hemoglobin: 13.3 (ref 12.0–16.0)
Neutrophils Absolute: 3.66
Platelets: 244 (ref 150–399)
WBC: 6.1

## 2021-08-10 LAB — COMPREHENSIVE METABOLIC PANEL
Albumin: 4.2 (ref 3.5–5.0)
Calcium: 9.5 (ref 8.7–10.7)

## 2021-08-10 LAB — CBC: RBC: 4.52 (ref 3.87–5.11)

## 2021-08-11 ENCOUNTER — Telehealth: Payer: Self-pay

## 2021-08-11 NOTE — Telephone Encounter (Signed)
Pt notified of Dr Bobby Rumpf' assessment and plan. She verbalized understanding. ? ?LABS:  ?  ?CBC Latest Ref Rng & Units 08/10/2021 02/10/2021 08/10/2020  ?WBC - 6.1 7.1 7.0  ?Hemoglobin 12.0 - 16.0 13.3 14.1 13.9  ?Hematocrit 36 - 46 41 43 42  ?Platelets 150 - 399 244 270 231  ?  ?  Latest Reference Range & Units 08/10/21 09:15  ?Iron 28 - 170 ug/dL 60  ?UIBC ug/dL 382  ?TIBC 250 - 450 ug/dL 442  ?Saturation Ratios 10.4 - 31.8 % 14  ?Ferritin 11 - 307 ng/mL 22  ?  ?ASSESSMENT & PLAN:  ?A 71 y.o. female with iron deficiency anemia.  Based upon her labs today, her hemoglobin and iron parameters remain at an ideal levels.   Clinically, the patient is doing well.   I do feel comfortable turning her care back over to her primary care office, with the recommendation that her CBC and iron panel be checked at least 1-2 times per year.  I would not have a problem seeing her in the future if new hematologic/oncologic issues arise that require repeat clinical assessment.  The patient understands all the plans discussed today and is in agreement with them. ?  ?  ?I, Rita Ohara, am acting as scribe for Marice Potter, MD   ?  ?I have reviewed this report as typed by the medical scribe, and it is complete and accurate. ?  ?Dequincy Macarthur Critchley, MD ? ? ? ?

## 2021-08-24 ENCOUNTER — Other Ambulatory Visit: Payer: Self-pay

## 2021-08-24 MED ORDER — DILTIAZEM HCL ER COATED BEADS 120 MG PO CP24: 120.0000 mg | ORAL_CAPSULE | Freq: Every day | ORAL | 0 refills | Status: DC

## 2021-08-30 ENCOUNTER — Telehealth: Payer: Self-pay | Admitting: Cardiology

## 2021-08-30 NOTE — Telephone Encounter (Signed)
?*  STAT* If patient is at the pharmacy, call can be transferred to refill team. ? ? ?1. Which medications need to be refilled? (please list name of each medication and dose if known) clopidogrel (PLAVIX) 75 MG tablet; diltiazem (CARDIZEM CD) 120 MG 24 hr capsule ? ?2. Which pharmacy/location (including street and city if local pharmacy) is medication to be sent to? [place] ? ?3. Do they need a 30 day or 90 day supply? 90 days for both  ?

## 2021-08-31 DIAGNOSIS — I739 Peripheral vascular disease, unspecified: Secondary | ICD-10-CM | POA: Diagnosis not present

## 2021-08-31 DIAGNOSIS — I2510 Atherosclerotic heart disease of native coronary artery without angina pectoris: Secondary | ICD-10-CM | POA: Diagnosis not present

## 2021-08-31 DIAGNOSIS — I10 Essential (primary) hypertension: Secondary | ICD-10-CM | POA: Diagnosis not present

## 2021-08-31 DIAGNOSIS — M8580 Other specified disorders of bone density and structure, unspecified site: Secondary | ICD-10-CM | POA: Diagnosis not present

## 2021-08-31 DIAGNOSIS — D509 Iron deficiency anemia, unspecified: Secondary | ICD-10-CM | POA: Diagnosis not present

## 2021-08-31 DIAGNOSIS — E1159 Type 2 diabetes mellitus with other circulatory complications: Secondary | ICD-10-CM | POA: Diagnosis not present

## 2021-08-31 DIAGNOSIS — M109 Gout, unspecified: Secondary | ICD-10-CM | POA: Diagnosis not present

## 2021-08-31 DIAGNOSIS — E7800 Pure hypercholesterolemia, unspecified: Secondary | ICD-10-CM | POA: Diagnosis not present

## 2021-08-31 DIAGNOSIS — M25512 Pain in left shoulder: Secondary | ICD-10-CM | POA: Diagnosis not present

## 2021-08-31 DIAGNOSIS — R69 Illness, unspecified: Secondary | ICD-10-CM | POA: Diagnosis not present

## 2021-08-31 MED ORDER — CLOPIDOGREL BISULFATE 75 MG PO TABS: 75.0000 mg | ORAL_TABLET | Freq: Every day | ORAL | 1 refills | Status: DC

## 2021-08-31 MED ORDER — DILTIAZEM HCL ER COATED BEADS 120 MG PO CP24: 120.0000 mg | ORAL_CAPSULE | Freq: Every day | ORAL | 1 refills | Status: DC

## 2021-08-31 NOTE — Telephone Encounter (Signed)
Medication sent to requested pharmacy. I also advise to arrange appt for further refills.  ?

## 2021-09-14 DIAGNOSIS — I739 Peripheral vascular disease, unspecified: Secondary | ICD-10-CM | POA: Diagnosis not present

## 2021-09-14 DIAGNOSIS — M8580 Other specified disorders of bone density and structure, unspecified site: Secondary | ICD-10-CM | POA: Diagnosis not present

## 2021-09-14 DIAGNOSIS — D509 Iron deficiency anemia, unspecified: Secondary | ICD-10-CM | POA: Diagnosis not present

## 2021-09-14 DIAGNOSIS — M109 Gout, unspecified: Secondary | ICD-10-CM | POA: Diagnosis not present

## 2021-09-14 DIAGNOSIS — M25562 Pain in left knee: Secondary | ICD-10-CM | POA: Diagnosis not present

## 2021-09-14 DIAGNOSIS — I2510 Atherosclerotic heart disease of native coronary artery without angina pectoris: Secondary | ICD-10-CM | POA: Diagnosis not present

## 2021-09-14 DIAGNOSIS — Z139 Encounter for screening, unspecified: Secondary | ICD-10-CM | POA: Diagnosis not present

## 2021-09-14 DIAGNOSIS — R69 Illness, unspecified: Secondary | ICD-10-CM | POA: Diagnosis not present

## 2021-09-14 DIAGNOSIS — I10 Essential (primary) hypertension: Secondary | ICD-10-CM | POA: Diagnosis not present

## 2021-09-28 DIAGNOSIS — I2510 Atherosclerotic heart disease of native coronary artery without angina pectoris: Secondary | ICD-10-CM | POA: Diagnosis not present

## 2021-09-28 DIAGNOSIS — I739 Peripheral vascular disease, unspecified: Secondary | ICD-10-CM | POA: Diagnosis not present

## 2021-09-28 DIAGNOSIS — E1159 Type 2 diabetes mellitus with other circulatory complications: Secondary | ICD-10-CM | POA: Diagnosis not present

## 2021-09-28 DIAGNOSIS — R69 Illness, unspecified: Secondary | ICD-10-CM | POA: Diagnosis not present

## 2021-09-28 DIAGNOSIS — I10 Essential (primary) hypertension: Secondary | ICD-10-CM | POA: Diagnosis not present

## 2021-09-28 DIAGNOSIS — D509 Iron deficiency anemia, unspecified: Secondary | ICD-10-CM | POA: Diagnosis not present

## 2021-09-28 DIAGNOSIS — M25562 Pain in left knee: Secondary | ICD-10-CM | POA: Diagnosis not present

## 2021-09-28 DIAGNOSIS — M25512 Pain in left shoulder: Secondary | ICD-10-CM | POA: Diagnosis not present

## 2021-09-28 DIAGNOSIS — M109 Gout, unspecified: Secondary | ICD-10-CM | POA: Diagnosis not present

## 2021-09-28 DIAGNOSIS — Z6829 Body mass index (BMI) 29.0-29.9, adult: Secondary | ICD-10-CM | POA: Diagnosis not present

## 2021-10-26 DIAGNOSIS — E7800 Pure hypercholesterolemia, unspecified: Secondary | ICD-10-CM | POA: Diagnosis not present

## 2021-10-26 DIAGNOSIS — K219 Gastro-esophageal reflux disease without esophagitis: Secondary | ICD-10-CM | POA: Diagnosis not present

## 2021-10-26 DIAGNOSIS — R69 Illness, unspecified: Secondary | ICD-10-CM | POA: Diagnosis not present

## 2021-10-26 DIAGNOSIS — E663 Overweight: Secondary | ICD-10-CM | POA: Diagnosis not present

## 2021-10-26 DIAGNOSIS — M8580 Other specified disorders of bone density and structure, unspecified site: Secondary | ICD-10-CM | POA: Diagnosis not present

## 2021-10-26 DIAGNOSIS — I739 Peripheral vascular disease, unspecified: Secondary | ICD-10-CM | POA: Diagnosis not present

## 2021-10-26 DIAGNOSIS — I2510 Atherosclerotic heart disease of native coronary artery without angina pectoris: Secondary | ICD-10-CM | POA: Diagnosis not present

## 2021-10-26 DIAGNOSIS — I10 Essential (primary) hypertension: Secondary | ICD-10-CM | POA: Diagnosis not present

## 2021-10-26 DIAGNOSIS — E1159 Type 2 diabetes mellitus with other circulatory complications: Secondary | ICD-10-CM | POA: Diagnosis not present

## 2021-10-26 DIAGNOSIS — D509 Iron deficiency anemia, unspecified: Secondary | ICD-10-CM | POA: Diagnosis not present

## 2021-10-26 DIAGNOSIS — M109 Gout, unspecified: Secondary | ICD-10-CM | POA: Diagnosis not present

## 2021-11-09 DIAGNOSIS — E7800 Pure hypercholesterolemia, unspecified: Secondary | ICD-10-CM | POA: Diagnosis not present

## 2021-11-09 DIAGNOSIS — I2510 Atherosclerotic heart disease of native coronary artery without angina pectoris: Secondary | ICD-10-CM | POA: Diagnosis not present

## 2021-11-09 DIAGNOSIS — M8580 Other specified disorders of bone density and structure, unspecified site: Secondary | ICD-10-CM | POA: Diagnosis not present

## 2021-11-09 DIAGNOSIS — I10 Essential (primary) hypertension: Secondary | ICD-10-CM | POA: Diagnosis not present

## 2021-11-09 DIAGNOSIS — M109 Gout, unspecified: Secondary | ICD-10-CM | POA: Diagnosis not present

## 2021-11-09 DIAGNOSIS — K219 Gastro-esophageal reflux disease without esophagitis: Secondary | ICD-10-CM | POA: Diagnosis not present

## 2021-11-09 DIAGNOSIS — M7701 Medial epicondylitis, right elbow: Secondary | ICD-10-CM | POA: Diagnosis not present

## 2021-11-09 DIAGNOSIS — R69 Illness, unspecified: Secondary | ICD-10-CM | POA: Diagnosis not present

## 2021-11-09 DIAGNOSIS — E1159 Type 2 diabetes mellitus with other circulatory complications: Secondary | ICD-10-CM | POA: Diagnosis not present

## 2021-11-09 DIAGNOSIS — F3341 Major depressive disorder, recurrent, in partial remission: Secondary | ICD-10-CM | POA: Diagnosis not present

## 2021-11-09 DIAGNOSIS — D509 Iron deficiency anemia, unspecified: Secondary | ICD-10-CM | POA: Diagnosis not present

## 2021-11-09 DIAGNOSIS — I739 Peripheral vascular disease, unspecified: Secondary | ICD-10-CM | POA: Diagnosis not present

## 2021-11-18 ENCOUNTER — Other Ambulatory Visit: Payer: Self-pay | Admitting: Interventional Radiology

## 2021-11-18 DIAGNOSIS — K559 Vascular disorder of intestine, unspecified: Secondary | ICD-10-CM

## 2021-11-29 DIAGNOSIS — E7800 Pure hypercholesterolemia, unspecified: Secondary | ICD-10-CM | POA: Diagnosis not present

## 2021-11-29 DIAGNOSIS — K219 Gastro-esophageal reflux disease without esophagitis: Secondary | ICD-10-CM | POA: Diagnosis not present

## 2021-11-29 DIAGNOSIS — M8580 Other specified disorders of bone density and structure, unspecified site: Secondary | ICD-10-CM | POA: Diagnosis not present

## 2021-11-29 DIAGNOSIS — I739 Peripheral vascular disease, unspecified: Secondary | ICD-10-CM | POA: Diagnosis not present

## 2021-11-29 DIAGNOSIS — E663 Overweight: Secondary | ICD-10-CM | POA: Diagnosis not present

## 2021-11-29 DIAGNOSIS — E1159 Type 2 diabetes mellitus with other circulatory complications: Secondary | ICD-10-CM | POA: Diagnosis not present

## 2021-11-29 DIAGNOSIS — I2510 Atherosclerotic heart disease of native coronary artery without angina pectoris: Secondary | ICD-10-CM | POA: Diagnosis not present

## 2021-11-29 DIAGNOSIS — I10 Essential (primary) hypertension: Secondary | ICD-10-CM | POA: Diagnosis not present

## 2021-11-29 DIAGNOSIS — R69 Illness, unspecified: Secondary | ICD-10-CM | POA: Diagnosis not present

## 2021-11-29 DIAGNOSIS — D509 Iron deficiency anemia, unspecified: Secondary | ICD-10-CM | POA: Diagnosis not present

## 2021-11-29 DIAGNOSIS — M109 Gout, unspecified: Secondary | ICD-10-CM | POA: Diagnosis not present

## 2021-12-06 ENCOUNTER — Ambulatory Visit
Admission: RE | Admit: 2021-12-06 | Discharge: 2021-12-06 | Disposition: A | Discharge status: Home / Self Care | Payer: Medicare HMO | Source: Ambulatory Visit | Attending: Interventional Radiology | Admitting: Interventional Radiology

## 2021-12-06 ENCOUNTER — Encounter: Payer: Self-pay | Admitting: *Deleted

## 2021-12-06 DIAGNOSIS — K559 Vascular disorder of intestine, unspecified: Secondary | ICD-10-CM

## 2021-12-06 DIAGNOSIS — K551 Chronic vascular disorders of intestine: Secondary | ICD-10-CM | POA: Diagnosis not present

## 2021-12-06 DIAGNOSIS — Z9889 Other specified postprocedural states: Secondary | ICD-10-CM | POA: Diagnosis not present

## 2021-12-06 HISTORY — PX: IR RADIOLOGIST EVAL & MGMT: IMG5224

## 2021-12-06 NOTE — Progress Notes (Signed)
Chief Complaint: Mesenteric Artery Disease   Referring Physician(s): Dr. Jackquline Denmark  Cardiology:  Dr. Shirlee More PCP: Dr. Truman Hayward    History of Present Illness: Marisa Callahan is a 71 y.o. 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   Marisa Callahan joins [place] today by way of virtual visit.  We confirmed her identity with 2 personal identifiers.   History:  Marisa Callahan was referred to [place] with symptoms of CMI, with weight-loss, discomfort with meals, and multiple episodes of self-limited ischemic colitis as well as Cdiff colitis.  She was seen in our clinic 04/15/20 by my partner Dr. Vernard Gambles.   We treated Marisa Callahan on 05/06/20 with right CFA access, mesenteric angiogram, and treatment of critical celiac artery stenosis with bare metal balloon mounted stents (overlapping [place]). 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  She also has prior IMA ligation secondary to open AAA repair.     Interval History: Today, Marisa Callahan tells me that she is "feeling just fine."  She denies any recurrent symptoms of post-prandial pain, weight loss, food fear.  She denies nausea, vomiting, hematemesis, melena, hematochezia.    In fact she says she has comfortably gained "about 10 pounds."  She started amlodipine recently for blood pressure control, by her PCP, and is now taking 2 blood pressure meds.  She remains on DAPT.  She is taking lipitor.    Denies interval hospitalization.   Past Medical History:  Diagnosis Date   Abdominal pain 02/28/2017   Abnormal myocardial perfusion study 06/19/2017   Anemia 03/02/2017   Anxiety 02/28/2017   Arthritis 02/28/2017   Atherosclerosis of native artery of both lower extremities ([place]) 09/28/2015   Bilateral carotid artery stenosis 09/28/2015   Boil of buttock 02/28/2017   Overview:  right    Cancer ([place]) 1977   cervix    Colitis 02/28/2017   Colon polyp    Coronary artery disease 02/28/2017   Overview:  history of coronary stents   Coronary artery disease involving native coronary artery of native heart with angina pectoris ([place]) 09/28/2015   Dehydration 02/28/2017   Depression 02/28/2017   Diabetes mellitus ([place]) 02/28/2017   GERD (gastroesophageal reflux disease) 02/28/2017   GI bleed    History of AAA (abdominal aortic aneurysm) repair 12/03/2009   with [place]   Hypercholesteremia 02/28/2017   Hypertension 02/28/2017   Hypotension 02/28/2017   [facility] 02/19/17   Incisional hernia, incarcerated 02/28/2017   Iron deficiency anemia due to chronic blood loss 05/14/2020   Left bundle branch block 02/28/2017   Legally blind in right eye, as defined in [place] 02/28/2017   MI (myocardial infarction) ([place]) 1999   Osteoarthritis of spine 02/28/2017   PVD (peripheral vascular disease) ([place]) 09/28/2015   Sinus tachycardia    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/14/2014   right eye stroke   UTI (urinary tract infection) 02/28/2017   Ventral hernia 09/28/2015    Past Surgical History:  Procedure Laterality Date   ABDOMINAL AORTIC ANEURYSM REPAIR     open   ABDOMINAL HERNIA REPAIR     with dehiscence and chronic wound   ABDOMINAL HYSTERECTOMY     BACK SURGERY     as a child   BYPASS GRAFT     CARDIAC CATHETERIZATION     CESAREAN SECTION  x2   CHOLECYSTECTOMY     COLONOSCOPY  02/12/2017   Ischemic colitis (involving distal transvere colon, descending colon and proximal sigmoid colon) Moderate sigmoid diverticulosis   HAND SURGERY Bilateral    repair   HERNIA REPAIR  2017   IR ANGIOGRAM VISCERAL SELECTIVE  05/06/2020   IR RADIOLOGIST EVAL & MGMT  04/15/2020   IR RADIOLOGIST EVAL & MGMT  07/01/2020   IR RADIOLOGIST EVAL & MGMT  12/16/2020   IR TRANSCATH PLC STENT 1ST ART NOT LE CV CAR VERT CAR  05/06/2020   IR US GUIDE VASC ACCESS RIGHT  05/06/2020    TRIGGER FINGER RELEASE     x2    Allergies: Tape  Medications: Prior to Admission medications   Medication Sig Start Date End Date Taking? Authorizing Provider  acetaminophen (TYLENOL) 500 MG tablet Take 1,000-1,500 mg by mouth every 8 (eight) hours as needed for moderate pain or headache.    [provider]  aspirin EC 81 MG tablet Take 81 mg by mouth at bedtime.     [provider]  atorvastatin (LIPITOR) 40 MG tablet Take 40 mg by mouth at bedtime.    [provider]  Calcium Carb-Cholecalciferol (CALCIUM 600 + D PO) Take 1 tablet by mouth 2 (two) times daily.    [provider]  citalopram (CELEXA) 20 MG tablet Take 20 mg by mouth daily.    [provider]  clopidogrel (PLAVIX) 75 MG tablet Take 1 tablet (75 mg total) by mouth daily. 08/31/21   Richardo Priest, MD  diclofenac Sodium (VOLTAREN) 1 % GEL Apply 1 application topically daily as needed for pain (pain).    [provider]  diltiazem (CARDIZEM CD) 120 MG 24 hr capsule Take 1 capsule (120 mg total) by mouth daily. 08/31/21   Richardo Priest, MD  ezetimibe (ZETIA) 10 MG tablet Take 10 mg by mouth daily.    [provider]  folic acid (FOLVITE) 1 MG tablet Take 1 mg by mouth daily.    [provider]  lisinopril (ZESTRIL) 30 MG tablet Take 30 mg by mouth daily.  10/07/19   [provider]  nitroGLYCERIN (NITROSTAT) 0.4 MG SL tablet Place 1 tablet (0.4 mg total) under the tongue every 5 (five) minutes as needed for chest pain. 05/27/21   Richardo Priest, MD  pantoprazole (PROTONIX) 40 MG tablet Take 40 mg by mouth daily.    [provider]     Family History  Problem Relation Age of Onset   Hypertension Mother    Diabetes Mother    Stroke Mother    Congestive Heart Failure Mother    Hypertension Sister    Diabetes Sister    Congestive Heart Failure Sister    Depression Sister    Diabetes Brother    Cerebral aneurysm Father    Diabetes  Sister    Kidney disease Sister    Colon cancer Neg Hx    Colon polyps Neg Hx    Esophageal cancer Neg Hx    Rectal cancer Neg Hx    Stomach cancer Neg Hx     Social History   Socioeconomic History   Marital status: Married    Spouse name: [name]   Number of children: 3   Years of education: Not on file   Highest education level: 10th grade  Occupational History   Not on file  Tobacco Use   Smoking status: Former    Types: Cigarettes  Quit date: 1999    Years since quitting: 24.5   Smokeless tobacco: Never  Vaping Use   Vaping Use: Never used  Substance and Sexual Activity   Alcohol use: No   Drug use: Yes    Frequency: 7.0 times per week    Types: Marijuana    Comment: last on 05/04/20   Sexual activity: Yes  Other Topics Concern   Not on file  Social History Narrative   Not on file   Social Determinants of Health   Financial Resource Strain: Not on file  Food Insecurity: Not on file  Transportation Needs: Not on file  Physical Activity: Not on file  Stress: Not on file  Social Connections: Not on file       Review of Systems  Review of Systems: A 12 point ROS discussed and pertinent positives are indicated in the HPI above.  All other systems are negative.   Physical Exam No direct physical exam was performed (except for noted visual exam findings with Video Visits).   Vital Signs: There were no vitals taken for this visit.  Imaging: No results found.  Labs:  CBC: Recent Labs    02/10/21 0000 08/10/21 0000  WBC 7.1 6.1  HGB 14.1 13.3  HCT 43 41  PLT 270 244    COAGS: No results for input(s): "INR", "APTT" in the last 8760 hours.  BMP: Recent Labs    02/10/21 0000 08/10/21 0000  NA 133* 138  K 4.4 4.4  CL 101 104  CO2 22 29*  BUN 10 9  CALCIUM 9.4 9.5  CREATININE 0.7 0.7    LIVER FUNCTION TESTS: Recent Labs    02/10/21 0000 08/10/21 0000  AST 28 23  ALT 21 21  ALKPHOS 65 64  ALBUMIN 4.2 4.2    TUMOR  MARKERS: No results for input(s): "AFPTM", "CEA", "CA199", "CHROMGRNA" in the last 8760 hours.  Assessment and Plan:  Marisa Callahan is a very pleasant 71 year old female SP treatment for chronic mesenteric ischemia, 05/06/20 with balloon expandable stenting of celiac artery.     She tells me she is "feeling fine" and has no symptoms.  She has had some comfortably weight gain.    She continues on dual anti-platelet medication, which I agree with for her pattern of disease and additional PAD/CAD history. Continues on lipitor, as well as 2 blood pressure medications.     We will schedule her for follow up in 12 months, and she agrees. She also knows that she should seek emergent medical attention should she have any new acute abdominal pain or concerns.   Plan: - 12 month follow up visit, with no imaging required.  She knows that she may follow up with [place] at any time should she have any problems. - agree with dual anti-platelet treatment.    - Maximal medical therapy     Electronically Signed: Corrie Mckusick 12/06/2021, 8:42 AM   I spent a total of    15 Minutes in remote  clinical consultation, greater than 50% of which was counseling/coordinating care for mesenteric arterial disease, SP stenting of the celiac artery.    Visit type: Audio only (telephone). Audio (no video) only due to patient's lack of internet/smartphone capability. Alternative for in-person consultation at [facility], [address], [place], [place]. This visit type was conducted due to national recommendations for restrictions regarding the COVID-19 Pandemic (e.g. social distancing).  This format is felt to be most appropriate for this  patient at this time.  All issues noted in this document were discussed and addressed.

## 2022-02-01 DIAGNOSIS — Z1231 Encounter for screening mammogram for malignant neoplasm of breast: Secondary | ICD-10-CM | POA: Diagnosis not present

## 2022-02-02 DIAGNOSIS — H2513 Age-related nuclear cataract, bilateral: Secondary | ICD-10-CM | POA: Diagnosis not present

## 2022-02-16 DIAGNOSIS — E782 Mixed hyperlipidemia: Secondary | ICD-10-CM | POA: Diagnosis not present

## 2022-02-16 DIAGNOSIS — I10 Essential (primary) hypertension: Secondary | ICD-10-CM | POA: Diagnosis not present

## 2022-02-16 DIAGNOSIS — Z95820 Peripheral vascular angioplasty status with implants and grafts: Secondary | ICD-10-CM | POA: Diagnosis not present

## 2022-02-16 DIAGNOSIS — I739 Peripheral vascular disease, unspecified: Secondary | ICD-10-CM | POA: Diagnosis not present

## 2022-02-28 ENCOUNTER — Telehealth: Payer: Self-pay | Admitting: Cardiology

## 2022-02-28 ENCOUNTER — Other Ambulatory Visit: Payer: Self-pay

## 2022-02-28 MED ORDER — CLOPIDOGREL BISULFATE 75 MG PO TABS: 75.0000 mg | ORAL_TABLET | Freq: Every day | ORAL | 0 refills | Status: DC

## 2022-02-28 NOTE — Telephone Encounter (Signed)
*  STAT* If patient is at the pharmacy, call can be transferred to refill team.   1. Which medications need to be refilled? (please list name of each medication and dose if known)   clopidogrel (PLAVIX) 75 MG tablet    2. Which pharmacy/location (including street and city if local pharmacy) is medication to be sent to? [place], [place]  3. Do they need a 30 day or 90 day supply?  90 day  Pt has schedule appt on 04/07/22. Please advise

## 2022-02-28 NOTE — Telephone Encounter (Signed)
Plavix '75mg'$  q d #90 ref sent to [facility] ) ref as pt has appt in November.

## 2022-03-01 DIAGNOSIS — F3341 Major depressive disorder, recurrent, in partial remission: Secondary | ICD-10-CM | POA: Diagnosis not present

## 2022-03-01 DIAGNOSIS — I739 Peripheral vascular disease, unspecified: Secondary | ICD-10-CM | POA: Diagnosis not present

## 2022-03-01 DIAGNOSIS — M8580 Other specified disorders of bone density and structure, unspecified site: Secondary | ICD-10-CM | POA: Diagnosis not present

## 2022-03-01 DIAGNOSIS — I2510 Atherosclerotic heart disease of native coronary artery without angina pectoris: Secondary | ICD-10-CM | POA: Diagnosis not present

## 2022-03-01 DIAGNOSIS — D509 Iron deficiency anemia, unspecified: Secondary | ICD-10-CM | POA: Diagnosis not present

## 2022-03-01 DIAGNOSIS — R5382 Chronic fatigue, unspecified: Secondary | ICD-10-CM | POA: Diagnosis not present

## 2022-03-01 DIAGNOSIS — E1159 Type 2 diabetes mellitus with other circulatory complications: Secondary | ICD-10-CM | POA: Diagnosis not present

## 2022-03-01 DIAGNOSIS — M7701 Medial epicondylitis, right elbow: Secondary | ICD-10-CM | POA: Diagnosis not present

## 2022-03-01 DIAGNOSIS — E7800 Pure hypercholesterolemia, unspecified: Secondary | ICD-10-CM | POA: Diagnosis not present

## 2022-03-01 DIAGNOSIS — M109 Gout, unspecified: Secondary | ICD-10-CM | POA: Diagnosis not present

## 2022-03-01 DIAGNOSIS — I10 Essential (primary) hypertension: Secondary | ICD-10-CM | POA: Diagnosis not present

## 2022-03-01 DIAGNOSIS — R69 Illness, unspecified: Secondary | ICD-10-CM | POA: Diagnosis not present

## 2022-04-07 ENCOUNTER — Ambulatory Visit: Payer: Medicare HMO | Attending: Cardiology | Admitting: Cardiology

## 2022-04-07 ENCOUNTER — Encounter: Payer: Self-pay | Admitting: Cardiology

## 2022-04-07 VITALS — BP 138/60 | HR 71 | Ht 60.0 in | Wt 158.0 lb

## 2022-04-07 DIAGNOSIS — I10 Essential (primary) hypertension: Secondary | ICD-10-CM

## 2022-04-07 DIAGNOSIS — E7800 Pure hypercholesterolemia, unspecified: Secondary | ICD-10-CM

## 2022-04-07 DIAGNOSIS — I447 Left bundle-branch block, unspecified: Secondary | ICD-10-CM | POA: Diagnosis not present

## 2022-04-07 DIAGNOSIS — I25118 Atherosclerotic heart disease of native coronary artery with other forms of angina pectoris: Secondary | ICD-10-CM

## 2022-04-07 MED ORDER — DILTIAZEM HCL ER COATED BEADS 120 MG PO CP24: 120.0000 mg | ORAL_CAPSULE | Freq: Every day | ORAL | 3 refills | Status: DC

## 2022-04-07 MED ORDER — CLOPIDOGREL BISULFATE 75 MG PO TABS: 75.0000 mg | ORAL_TABLET | Freq: Every day | ORAL | 3 refills | Status: DC

## 2022-04-07 NOTE — Progress Notes (Signed)
Cardiology Office Note:    Date:  04/07/2022   ID:  Marisa Callahan, DOB Feb 17, 1951, MRN [id number]  PCP:  Marisa Nakai, MD  Cardiologist:  Marisa More, MD    Referring MD: Marisa Nakai, MD please do a LP(a) level with her next lipid profile   ASSESSMENT:    1. Coronary artery disease of native artery of native heart with stable angina pectoris ([place])   2. Left bundle branch block   3. Essential hypertension   4. Hypercholesteremia    PLAN:    In order of problems listed above:  She is doing well with CAD rare stable anginal pattern New York Heart Association class II she will continue her dual antiplatelet therapy with diffuse vascular disease along with her rate limiting calcium channel blocker and combined lipid therapy with Lipitor and Zetia.  We discussed doing an ischemia evaluation however she is pleased with the quality of her life and wants to hold off unless she has worsening symptoms this is reasonable Stable EKG pattern without progressive conduction system disease Stable well-controlled continue treatment including ACE inhibitor with her vascular disease Lipids at target continue combined therapy statin Zetia   Next appointment: 1 year   Medication Adjustments/Labs and Tests Ordered: Current medicines are reviewed at length with the patient today.  Concerns regarding medicines are outlined above.  Orders Placed This Encounter  Procedures   EKG 12-Lead   Meds ordered this encounter  Medications   clopidogrel (PLAVIX) 75 MG tablet    Sig: Take 1 tablet (75 mg total) by mouth daily.    Dispense:  90 tablet    Refill:  3   diltiazem (CARDIZEM CD) 120 MG 24 hr capsule    Sig: Take 1 capsule (120 mg total) by mouth daily.    Dispense:  90 capsule    Refill:  3    Chief complaint follow-up CAD   History of Present Illness:    Marisa Callahan is a 71 y.o. female with a hx of coronary artery disease with PCI in 1999 left bundle branch block open surgical correction  abdominal aortic aneurysm with aortobifemoral bypass in 2011 history of ischemic colitis with previous GI bleed hypertension hyperlipidemia type 2 diabetes and carotid artery disease with previous surgery last seen 12/15/2020.  She also has had mesenteric ischemia and PCI and stent by interventional radiology.  She had a myocardial perfusion study performed in 2019 where she had right coronary artery ischemia EF 51%.  She also had fixed defect present however EF was preserved and she had left bundle branch block.  Previously with ischemia, colitis she had anemia requiring transfusion and discontinuation of antiplatelet therapy with clopidogrel. Her last hemoglobin was normal 13.3 with a low ferritin 22 saturation 14 iron of 60.  Indices 1-year ago.  Compliance with diet, lifestyle and medications: Yes  She is feeling much improved especially with resolution of her anemia she has rare angina perhaps every few months and is relieved with rest or nitroglycerin.  Presently not short of breath no edema chest pain or palpitation.  She is on dual antiplatelet therapy without bleeding and tolerates her lipid-lowering combined atorvastatin and Zetia without muscle pain or weakness Past Medical History:  Diagnosis Date   Abdominal pain 02/28/2017   Abnormal myocardial perfusion study 06/19/2017   Anemia 03/02/2017   Anxiety 02/28/2017   Arthritis 02/28/2017   Atherosclerosis of native artery of both lower extremities ([place]) 09/28/2015   Bilateral carotid artery stenosis 09/28/2015   Boil  of buttock 02/28/2017   Overview:  right   Cancer ([place]) 1977   cervix    Colitis 02/28/2017   Colon polyp    Coronary artery disease 02/28/2017   Overview:  history of coronary stents   Coronary artery disease involving native coronary artery of native heart with angina pectoris ([place]) 09/28/2015   Dehydration 02/28/2017   Depression 02/28/2017   Diabetes mellitus ([place]) 02/28/2017   GERD (gastroesophageal reflux  disease) 02/28/2017   GI bleed    History of AAA (abdominal aortic aneurysm) repair 12/03/2009   with [place]fem [place]   Hypercholesteremia 02/28/2017   Hypertension 02/28/2017   Hypotension 02/28/2017   [facility] 02/19/17   Incisional hernia, incarcerated 02/28/2017   Iron deficiency anemia due to chronic blood loss 05/14/2020   Left bundle branch block 02/28/2017   Legally blind in right eye, as defined in [place] 02/28/2017   MI (myocardial infarction) ([place]) 1999   Osteoarthritis of spine 02/28/2017   PVD (peripheral vascular disease) ([place]) 09/28/2015   Sinus tachycardia    Stroke ([place]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/14/2014   right eye stroke   UTI (urinary tract infection) 02/28/2017   Ventral hernia 09/28/2015    Past Surgical History:  Procedure Laterality Date   ABDOMINAL AORTIC ANEURYSM REPAIR     open   ABDOMINAL HERNIA REPAIR     with dehiscence and chronic wound   ABDOMINAL HYSTERECTOMY     BACK SURGERY     as a child   BYPASS GRAFT     CARDIAC CATHETERIZATION     CESAREAN SECTION     x2   CHOLECYSTECTOMY     COLONOSCOPY  02/12/2017   Ischemic colitis (involving distal transvere colon, descending colon and proximal sigmoid colon) Moderate sigmoid diverticulosis   HAND SURGERY Bilateral    repair   HERNIA REPAIR  2017   IR ANGIOGRAM VISCERAL SELECTIVE  05/06/2020   IR RADIOLOGIST EVAL & MGMT  04/15/2020   IR RADIOLOGIST EVAL & MGMT  07/01/2020   IR RADIOLOGIST EVAL & MGMT  12/16/2020   IR RADIOLOGIST EVAL & MGMT  12/06/2021   IR TRANSCATH PLC STENT 1ST ART NOT LE CV CAR VERT CAR  05/06/2020   IR US GUIDE VASC ACCESS RIGHT  05/06/2020   TRIGGER FINGER RELEASE     x2    Current Medications: Current Meds  Medication Sig   acetaminophen (TYLENOL) 500 MG tablet Take 1,000-1,500 mg by mouth every 8 (eight) hours as needed for moderate pain or headache.   aspirin EC 81 MG tablet Take 81 mg by mouth at bedtime.    atorvastatin (LIPITOR) 40 MG tablet Take 40 mg by mouth at  bedtime.   Calcium Carb-Cholecalciferol (CALCIUM 600 + D PO) Take 1 tablet by mouth 2 (two) times daily.   citalopram (CELEXA) 20 MG tablet Take 20 mg by mouth daily.   diclofenac Sodium (VOLTAREN) 1 % GEL Apply 1 application topically daily as needed for pain (pain).   ezetimibe (ZETIA) 10 MG tablet Take 10 mg by mouth daily.   folic acid (FOLVITE) 1 MG tablet Take 1 mg by mouth daily.   lisinopril (ZESTRIL) 30 MG tablet Take 30 mg by mouth daily.    nitroGLYCERIN (NITROSTAT) 0.4 MG SL tablet Place 1 tablet (0.4 mg total) under the tongue every 5 (five) minutes as needed for chest pain.   pantoprazole (PROTONIX) 40 MG tablet Take 40 mg by mouth daily.   [DISCONTINUED] clopidogrel (PLAVIX) 75 MG tablet Take  1 tablet (75 mg total) by mouth daily.   [DISCONTINUED] diltiazem (CARDIZEM CD) 120 MG 24 hr capsule Take 1 capsule (120 mg total) by mouth daily.     Allergies:   Tape   Social History   Socioeconomic History   Marital status: Married    Spouse name: [name]   Number of children: 3   Years of education: Not on file   Highest education level: 10th grade  Occupational History   Not on file  Tobacco Use   Smoking status: Former    Types: Cigarettes    Quit date: 1999    Years since quitting: 24.8   Smokeless tobacco: Never  Vaping Use   Vaping Use: Never used  Substance and Sexual Activity   Alcohol use: No   Drug use: Yes    Frequency: 7.0 times per week    Types: Marijuana    Comment: last on 05/04/20   Sexual activity: Yes  Other Topics Concern   Not on file  Social History Narrative   Not on file   Social Determinants of Health   Financial Resource Strain: Not on file  Food Insecurity: Not on file  Transportation Needs: Not on file  Physical Activity: Not on file  Stress: Not on file  Social Connections: Not on file     Family History: The patient's family history includes Cerebral aneurysm in her father; Congestive Heart Failure in her mother and sister;  Depression in her sister; Diabetes in her brother, mother, sister, and sister; Hypertension in her mother and sister; Kidney disease in her sister; Stroke in her mother. There is no history of Colon cancer, Colon polyps, Esophageal cancer, Rectal cancer, or Stomach cancer. ROS:   Please see the history of present illness.    All other systems reviewed and are negative.  EKGs/Labs/Other Studies Reviewed:    The following studies were reviewed today:  EKG:  EKG ordered today and personally reviewed.  The ekg ordered today demonstrates sinus rhythm left bundle branch block PVCs  Recent Labs: 11/29/2021: Cholesterol 151 LDL 67 A1c 6.5 hemoglobin 13.2 creatinine 0.85 potassium 4.7  Physical Exam:    VS:  BP 138/60 (BP Location: Right Arm, Patient Position: Sitting, Cuff Size: Normal)   Pulse 71   Ht 5' (1.524 m)   Wt 158 lb (71.7 kg)   SpO2 98%   BMI 30.86 kg/m     Wt Readings from Last 3 Encounters:  04/07/22 158 lb (71.7 kg)  08/10/21 147 lb (66.7 kg)  02/10/21 143 lb 6.4 oz (65 kg)     GEN:  Well nourished, well developed in no acute distress HEENT: Normal NECK: No JVD; No carotid bruits LYMPHATICS: No lymphadenopathy CARDIAC: RRR, no murmurs, rubs, gallops RESPIRATORY:  Clear to auscultation without rales, wheezing or rhonchi  ABDOMEN: Soft, non-tender, non-distended MUSCULOSKELETAL:  No edema; No deformity  SKIN: Warm and dry NEUROLOGIC:  Alert and oriented x 3 PSYCHIATRIC:  Normal affect    Signed, Marisa More, MD  04/07/2022 12:24 PM    [facility]

## 2022-04-07 NOTE — Patient Instructions (Addendum)
Medication Instructions:  Your physician has recommended you make the following change in your medication:  START: Multivitamin 1 daily after you finish your Folic Acid    Lab Work: None Ordered If you have labs (blood work) drawn today and your tests are completely normal, you will receive your results only by: [place] (if you have MyChart) OR A paper copy in the mail If you have any lab test that is abnormal or we need to change your treatment, we will call you to review the results.   Testing/Procedures: None Ordered   Follow-Up: At [facility], you and your health needs are our priority.  As part of our continuing mission to provide you with exceptional heart care, we have created designated Provider Care Teams.  These Care Teams include your primary Cardiologist (physician) and Advanced Practice Providers (APPs -  Physician Assistants and Nurse Practitioners) who all work together to provide you with the care you need, when you need it.  We recommend signing up for the patient portal called "MyChart".  Sign up information is provided on this After Visit Summary.  MyChart is used to connect with patients for Virtual Visits (Telemedicine).  Patients are able to view lab/test results, encounter notes, upcoming appointments, etc.  Non-urgent messages can be sent to your provider as well.   To learn more about what you can do with MyChart, go to [email or web address].    Your next appointment:   12 month(s)  The format for your next appointment:   In Person  Provider:   Shirlee More, MD    Other Instructions NA

## 2022-05-11 IMAGING — XA IR ANGIO/VISCERAL SELECTIVE EA VESSEL WO/W FLUSH
10 of 18 series · 10 of 24 positions shown · non-contrast
Comparison: none

INDICATION: 69-year-old female with a history of aortobifemoral open bypass for
abdominal aortic aneurysm about 10 years ago (IMA ligation), with
multiple cardiovascular risk factors and aortic atherosclerosis and
mesenteric disease. Her SMA is occluded on prior CT imaging with
stenosis evident in the celiac artery origin. She presents with
classic symptoms of chronic mesenteric ischemia, with recurrent
episodes of ischemic colitis. Today she presents for attempt at
mesenteric revascularization with endovascular option.

[Series 2: body 4 care · 2 acquisitions, 1 frame shown (1 of 8)]
[im 1/2]
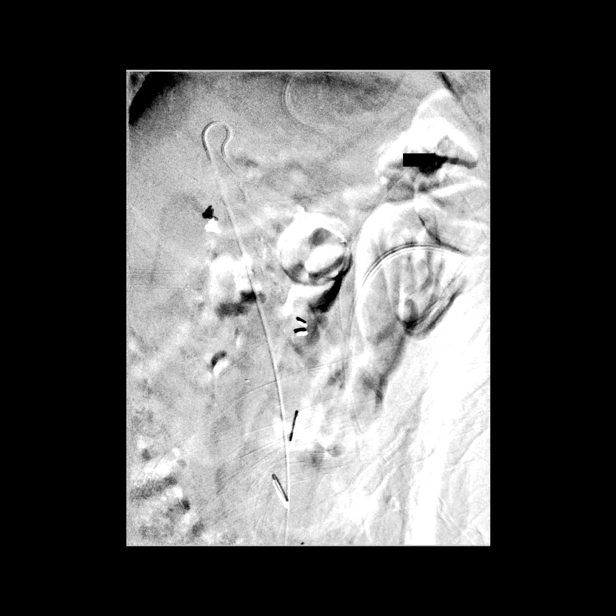

[Series 4: body 4 care · 1 of 6 frames shown (2 of 8)]
[frame 1/6]
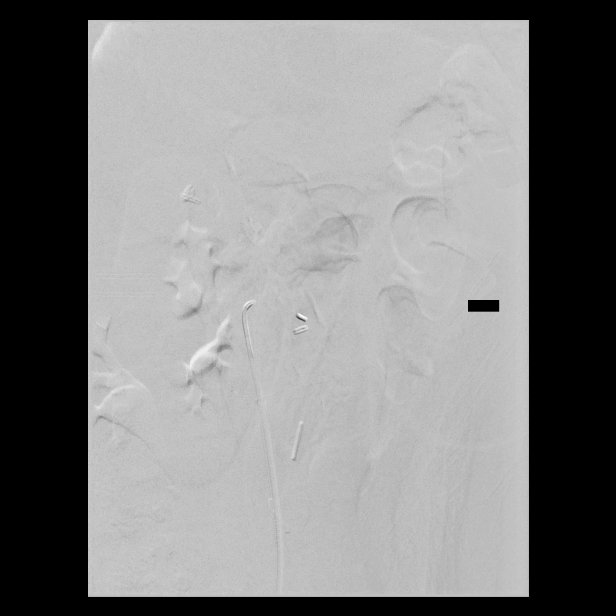

[Series 6: body 4 care · 1 of 13 frames shown (3 of 8)]
[frame 2/13]
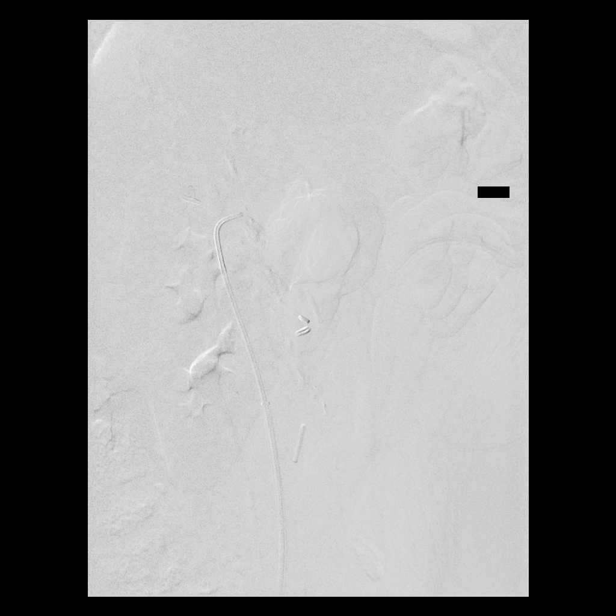

[Series 7: body 4 care · 1 of 19 frames shown (4 of 8)]
[frame 14/19]
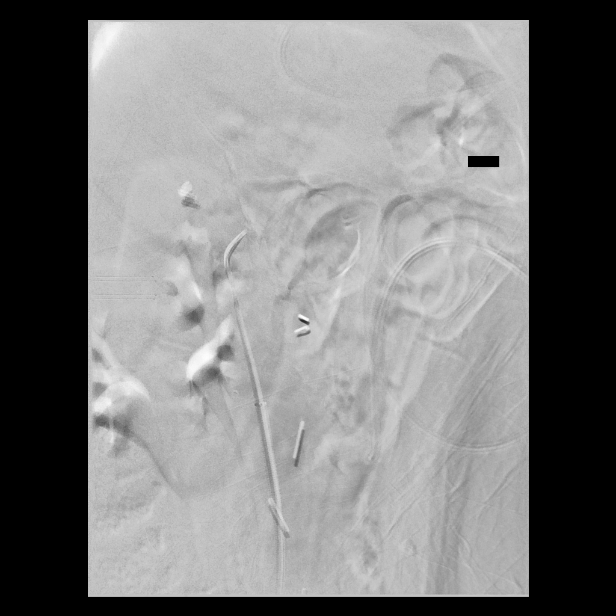

[Series 9: body 4 care · 1 of 16 frames shown (5 of 8)]
[frame 3/16]
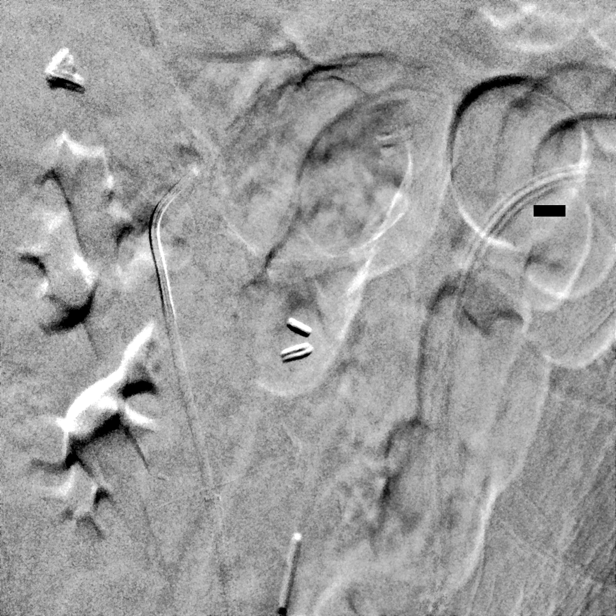

[Series 12: body 4 care · 1 of 13 frames shown (6 of 8)]
[frame 2/13]
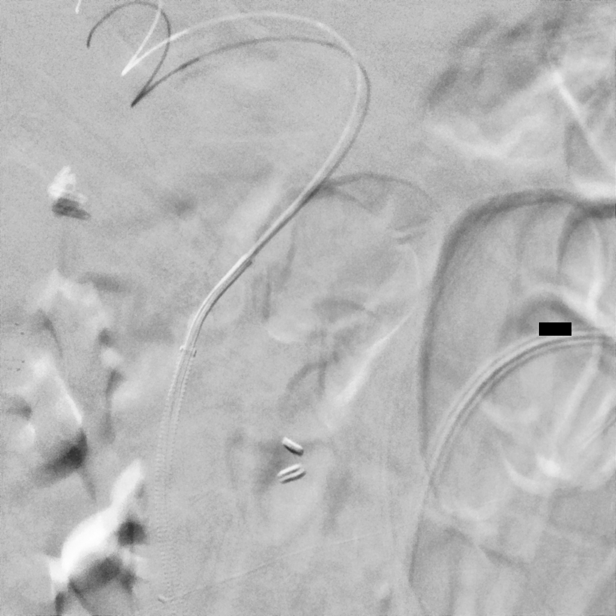

[Series 14: body 4 care · 2 acquisitions, 1 frame shown (7 of 8)]
[im 1/2]
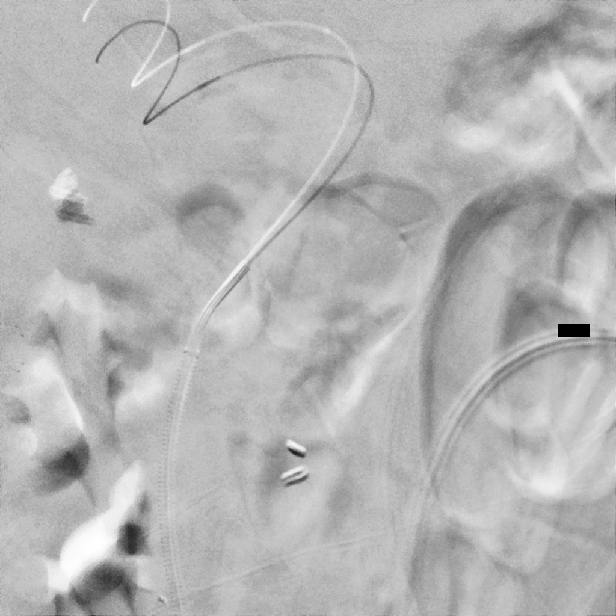

[Series 16: single · 1 of 1 slices shown (1 of 2)]
[im 1/1]
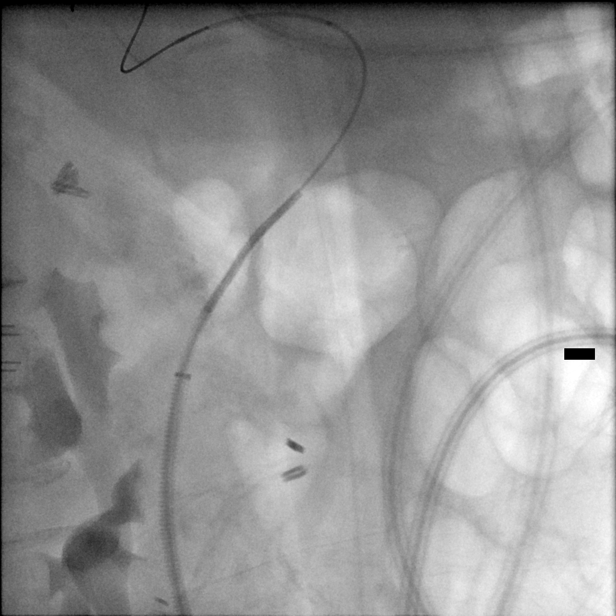

[Series 19: single · 1 of 1 slices shown (2 of 2)]
[im 1/1]
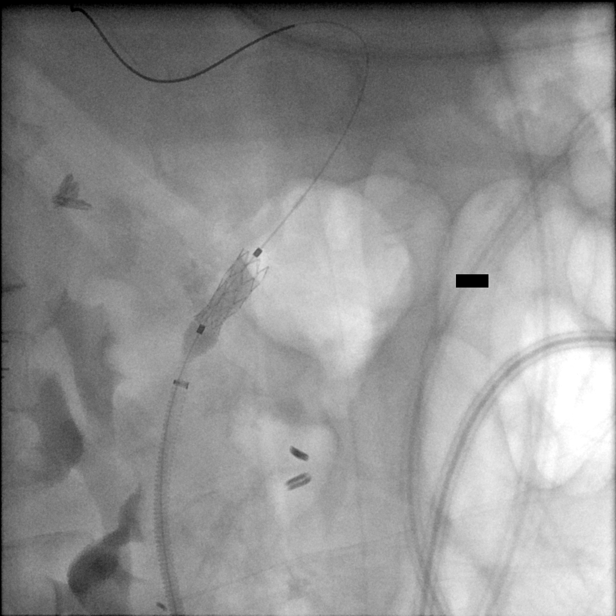

[Series 23: body 4 care · 1 of 19 frames shown (8 of 8)]
[frame 10/19]
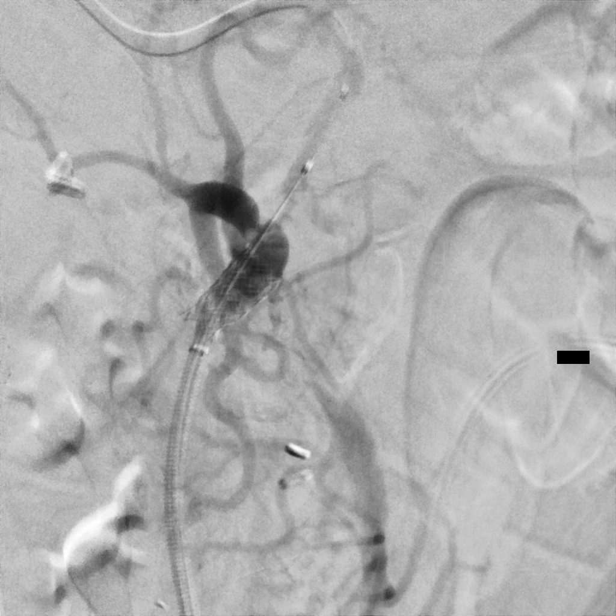

[10 of 24 positions shown; findings below may reference images not displayed]

EXAM:
ULTRASOUND-GUIDED ACCESS RIGHT COMMON FEMORAL ARTERY

AORTO MESENTERIC ANGIOGRAM

TREATMENT OF HIGH-GRADE STENOSIS OF CELIAC ARTERY ORIGIN WITH
BALLOON ANGIOPLASTY AND BALLOON MOUNTED BARE METAL STENT X2

CELT DEVICE DEPLOYED FOR HEMOSTASIS

MEDICATIONS:
9555 units IV heparin. 650 mg p.o. aspirin, 300 mg p.o. Plavix, 4 mg
IV Zofran

ANESTHESIA/SEDATION:
Moderate (conscious) sedation was employed during this procedure. A
total of Versed 2.0 mg and Fentanyl 100 mcg was administered
intravenously.

Moderate Sedation Time: 113 minutes. The patient's level of
consciousness and vital signs were monitored continuously by
radiology nursing throughout the procedure under my direct
supervision.

CONTRAST:  200 cc

FLUOROSCOPY TIME:  Fluoroscopy Time: 19 minutes 30 seconds (2,400
mGy).

COMPLICATIONS:
None



Ultrasound survey of the right inguinal region was performed with
images stored and sent to PACs, confirming patency of the right
graft limb of the aortobifemoral graft.

A micropuncture needle was used access the right graft limb under
ultrasound. With excellent arterial blood flow returned, and an .018
micro wire was passed through the needle, observed enter the
abdominal aorta under fluoroscopy. The needle was removed, and a
micropuncture sheath was placed over the wire. The inner dilator and
wire were removed, and an 035 Bentson wire was advanced under
fluoroscopy into the abdominal aorta. We attempt to place the sheath
on the Bentson wire, with resistance entering the graft limb. Four
French dilator was placed on the Bentson wire. The Bentson wire was
removed and a stiff Amplatz wire was placed through the 4 French
dilator, carefully advanced into the abdominal aorta. Four French
dilator was removed. Six French dilation was performed. The 5 French
sheath was then placed on the Amplatz wire. The introducer was
removed and the sheath was flushed.

Amplatz wire was removed. 035 Bentson wire was then advanced through
the sheath, with the Cobra catheter advanced to level of the
abdominal aorta. Initial attempts at identified the origin of the
celiac artery and the SMA unsuccessful with the Cobra catheter.
Cobra catheter was then removed on the Bentson wire with placement
of 5 French omni flush catheter.

Aortic angiogram was performed, with 2 projection.

Bentson wire was then advanced through the Omni Flush catheter into
the abdominal aorta. We attempted to place a 6 French to [HOSPITAL]
destination sheath on the Bentson wire, with resistance at the
graft. New 5 French sheath was placed at the puncture site and then
we exchanged the Bentson wire for an Amplatz wire using cobra
diagnostic catheter.

With the Amplatz wire in place soft tissue dilation to 8 French was
performed. The 6 French destination sheath was then placed on the
Amplatz wire. Amplatz wire was removed, introducer was removed, and
the sheath was flushed.

Cobra catheter was then advanced through the destination sheath and
used to select the origin of the celiac artery. A gentle injection
confirmed that there was significant calcified plaque at the ostium,
with poor opacification of the vessel.

Catheter was withdrawn in attempt to identify the origin of the SMA.
This was unsuccessful given the dense Nuzhat Daigle plaque at the
origin of the SMA.

The Cobra catheter was then placed at the origin of the celiac
artery. A microcatheter system was then used to cross the origin.
This was a 14 fathom wire and a rapid transit neuro catheter. Once
the microcatheter and the microwire were within the distal splenic
artery, the Cobra catheter was advanced through the ostium to the
proximal celiac axis. The microwire was removed from the
microcatheter and contrast was injected confirming a luminal
position of the splenic artery.

018 SV 8 wire was then placed through the rapid transit into the
splenic artery. Rapid transit wire was removed.

Angiogram was performed to identify the wall of the aorta, at the
site of the atherosclerotic calcifications.

We then elected to primarily tree with a balloon mounted stent, 018
system, 7 mm x 14 mm Palmaz Blue. This was inflated to the 7 mm
nominal diameter under fluoroscopic observation. The balloon was
deflated and then advanced into the celiac artery. Angiogram through
the sheath confirmed flow through the segment. The delivery balloon
was removed.

We elected to post dilate and flare the proximal end of the stent,
with a 7 mm drug-eluting balloon, with atmospheric inflation to
mm. 7 mm x 60 mm Lutonix NORBERTO MARIO was advanced into the stent, with post
dilation of the stent and the ostium to 16 atmospheres, with a
recorded corresponding diameter of 7.6 mm. The balloon was inflated
for 90 seconds when the patient complained of nausea. The balloon
was deflated and removed. Patient's nausea resolved.

With the safety wire cross the stent, a coaxial glidewire was placed
into the abdominal aorta and the flush catheter was advanced into
the abdominal aorta just above the origin of the celiac artery.

Angiogram was performed through the flush catheter. This initial
angiogram demonstrated poor flow through the ostium of the celiac
artery. The flush catheter was withdrawn with a repeat angiogram at
the ostium, confirming poor flow through the stent system.

The flush catheter was removed with the safety wire in place
throughout. We elected to place an overlapping stent more
proximally, given the poor flow, anticipating additional plaque at
the ostium of the artery given the appearance on the prior CT and
the current angiogram. A second 7 mm blue stent was selected and
advanced through the ostia of the artery. We then performed an
angiogram through the sheath, which confirmed flow through the
original stent into the celiac artery. We interpreted this as
additional plaque at the ostium of the artery, partially restricting
flow.

We then deployed the overlapping stent with a more proximal
projection into the abdominal aorta, overlapping the initial stent.
Post dilation was performed with atmospheric pressure of 16
atmospheres, 7.6 mm diameter.

The balloon was deflated and advanced into the celiac artery.

Angiogram was performed through the sheath, confirming flow through
the stent system.

We removed the balloon delivery system. The Omni Flush catheter was
then advanced in a coaxial manner to the safety wire with a
Glidewire. Once the Omni flush within the abdominal aorta, hand
injection was performed confirming flow through the segment.

A formal angiogram was then performed in the right anterior oblique
projection confirming excellent flow through the stent system.

At this point all catheters wires were removed. The sheath was then
exchanged for a short standard 6 French sheath.

Celt hemostasis device was placed at the puncture site of the
femoral graft limb, with use of ultrasound and fluoroscopy.

Patient tolerated the procedure well and remained hemodynamically
stable throughout.

No complications were encountered and no significant blood loss.
IMPRESSION: Status post ultrasound guided access of right limb of aortobifemoral
graft for aorta mesenteric angiogram and bare metal balloon mounted
stenting of critical stenosis of the celiac artery contributing to
chronic mesenteric ischemia.

Celt device deployed for hemostasis.

Angiogram confirms occlusion of the SMA origin, filling through
collateral flow.

## 2022-12-28 ENCOUNTER — Other Ambulatory Visit: Payer: Self-pay | Admitting: Interventional Radiology

## 2022-12-28 DIAGNOSIS — K551 Chronic vascular disorders of intestine: Secondary | ICD-10-CM

## 2023-03-22 ENCOUNTER — Other Ambulatory Visit: Payer: Self-pay

## 2023-03-22 ENCOUNTER — Telehealth: Payer: Self-pay | Admitting: Cardiology

## 2023-03-22 MED ORDER — NITROGLYCERIN 0.4 MG SL SUBL: 0.4000 mg | SUBLINGUAL_TABLET | SUBLINGUAL | 2 refills | Status: DC | PRN

## 2023-03-22 MED ORDER — CLOPIDOGREL BISULFATE 75 MG PO TABS: 75.0000 mg | ORAL_TABLET | Freq: Every day | ORAL | 3 refills | Status: DC

## 2023-03-22 MED ORDER — DILTIAZEM HCL ER COATED BEADS 120 MG PO CP24: 120.0000 mg | ORAL_CAPSULE | Freq: Every day | ORAL | 3 refills | Status: DC

## 2023-03-22 NOTE — Telephone Encounter (Signed)
The patient's medications were re-filled and a detailed message explaining that her medications had been re-filled was left on her voice mail. Patient was instructed to contact the office if she had any questions.

## 2023-03-22 NOTE — Telephone Encounter (Signed)
*  STAT* If patient is at the pharmacy, call can be transferred to refill team.   1. Which medications need to be refilled? (please list name of each medication and dose if known) diltiazem (CARDIZEM CD) 120 MG 24 hr capsule   clopidogrel (PLAVIX) 75 MG tablet  nitroGLYCERIN (NITROSTAT) 0.4 MG SL tablet  2. Which pharmacy/location (including street and city if local pharmacy) is medication to be sent to?[facility] - [address] [place], [place] - [address] [facility] AT Portal to Registered [facility] Sites   3. Do they need a 30 day or 90 day supply? 90 day

## 2023-04-10 NOTE — Progress Notes (Unsigned)
Cardiology Office Note:    Date:  04/11/2023   ID:  Marisa Callahan, DOB Dec 15, 1950, MRN [id number]  PCP:  Simone Curia, MD  Cardiologist:  Norman Herrlich, MD    Referring MD: Simone Curia, MD    ASSESSMENT:    1. Coronary artery disease of native artery of native heart with stable angina pectoris (HCC)   2. Left bundle branch block   3. Essential hypertension   4. Hypercholesteremia    PLAN:    In order of problems listed above:   Stable CAD no anginal discomfort after remote PCI and current medical treatment continue her aspirin high intensity statin Zetia and rate limiting calcium channel blocker Stable EKG pattern Well-controlled asked to get in the habit of checking and recording her home blood pressure several times a week good technique validated device and bring to the office in the future Labs tomorrow with her PCP continue combined statin and Zetia goal LDL in her case to be 55 or less   Next appointment: 1 year  Medication Adjustments/Labs and Tests Ordered: Current medicines are reviewed at length with the patient today.  Concerns regarding medicines are outlined above.  Orders Placed This Encounter  Procedures   EKG 12-Lead   No orders of the defined types were placed in this encounter.    History of Present Illness:    Marisa Callahan is a 72 y.o. female with a hx of CAD with PCI in 1999 left bundle branch block open surgery abdominal aortic aneurysm with aortobifemoral bypass 2011 history of ischemic colitis mesenteric ischemia and PCI and stent previous GI bleed hypertension hyperlipidemia type 2 diabetes and carotid artery disease last seen 04/07/2022.  Seen by vascular surgery for carotid disease most recent duplex February 17, 2023 with 60 to 79% right internal carotid stenosis 20 to 39% left internal carotid stenosis.  Compliance with diet, lifestyle and medications: Yes  Overall doing well no chest pain edema shortness of breath palpitation or syncope She follows  with vascular surgery She tolerates her statin without muscle pain or weakness and actually is having labs drawn tomorrow in her PCP office.  Goal of LDL in her case to be less than 55 Past Medical History:  Diagnosis Date   Abdominal pain 02/28/2017   Abnormal myocardial perfusion study 06/19/2017   Anemia 03/02/2017   Anxiety 02/28/2017   Arthritis 02/28/2017   Atherosclerosis of native artery of both lower extremities (HCC) 09/28/2015   Bilateral carotid artery stenosis 09/28/2015   Boil of buttock 02/28/2017   Overview:  right   Cancer (HCC) 1977   cervix    Colitis 02/28/2017   Colon polyp    Coronary artery disease 02/28/2017   Overview:  history of coronary stents   Coronary artery disease involving native coronary artery of native heart with angina pectoris (HCC) 09/28/2015   Dehydration 02/28/2017   Depression 02/28/2017   Diabetes mellitus (HCC) 02/28/2017   GERD (gastroesophageal reflux disease) 02/28/2017   GI bleed    History of AAA (abdominal aortic aneurysm) repair 12/03/2009   [place]   Hypercholesteremia 02/28/2017   Hypertension 02/28/2017   Hypotension 02/28/2017   [facility] 02/19/17   Incisional hernia, incarcerated 02/28/2017   Iron deficiency anemia due to chronic blood loss 05/14/2020   Left bundle branch block 02/28/2017   Legally blind in right eye, as defined in [place] 02/28/2017   MI (myocardial infarction) (HCC) 1999   Osteoarthritis of spine 02/28/2017   PVD (  peripheral vascular disease) (HCC) 09/28/2015   Sinus tachycardia    Stroke (HCC) 02/14/2014   right eye stroke   UTI (urinary tract infection) 02/28/2017   Ventral hernia 09/28/2015    Current Medications: Current Meds  Medication Sig   acetaminophen (TYLENOL) 500 MG tablet Take 1,000-1,500 mg by mouth every 8 (eight) hours as needed for moderate pain or headache.   amLODipine (NORVASC) 10 MG tablet Take 10 mg by mouth daily.   aspirin EC 81 MG tablet  Take 81 mg by mouth at bedtime.    atorvastatin (LIPITOR) 40 MG tablet Take 40 mg by mouth at bedtime.   Calcium Carb-Cholecalciferol (CALCIUM 600 + D PO) Take 1 tablet by mouth 2 (two) times daily.   citalopram (CELEXA) 20 MG tablet Take 20 mg by mouth daily.   clopidogrel (PLAVIX) 75 MG tablet Take 1 tablet (75 mg total) by mouth daily.   diltiazem (CARDIZEM CD) 120 MG 24 hr capsule Take 1 capsule (120 mg total) by mouth daily.   ezetimibe (ZETIA) 10 MG tablet Take 10 mg by mouth daily.   lisinopril (ZESTRIL) 30 MG tablet Take 30 mg by mouth daily.    nitroGLYCERIN (NITROSTAT) 0.4 MG SL tablet Place 1 tablet (0.4 mg total) under the tongue every 5 (five) minutes as needed for chest pain.   pantoprazole (PROTONIX) 40 MG tablet Take 40 mg by mouth daily.      EKGs/Labs/Other Studies Reviewed:    The following studies were reviewed today:  EKG Interpretation Date/Time:  Tuesday April 11 2023 12:52:20 EST Ventricular Rate:  73 PR Interval:  152 QRS Duration:  128 QT Interval:  412 QTC Calculation: 453 R Axis:   84  Text Interpretation: Sinus rhythm with occasional Premature ventricular complexes Non-specific intra-ventricular conduction block Cannot rule out Septal infarct , age undetermined No previous ECGs available Confirmed by Norman Herrlich ([id number]) on 04/11/2023 12:54:15 PM    Physical Exam:    VS:  BP 132/68   Pulse 73   Ht 5' (1.524 m)   Wt 141 lb 6.4 oz (64.1 kg)   SpO2 96%   BMI 27.62 kg/m     Wt Readings from Last 3 Encounters:  04/11/23 141 lb 6.4 oz (64.1 kg)  04/07/22 158 lb (71.7 kg)  08/10/21 147 lb (66.7 kg)     GEN:  Well nourished, well developed in no acute distress HEENT: Normal NECK: No JVD; No carotid bruits LYMPHATICS: No lymphadenopathy CARDIAC: RRR, no murmurs, rubs, gallops RESPIRATORY:  Clear to auscultation without rales, wheezing or rhonchi  ABDOMEN: Soft, non-tender, non-distended MUSCULOSKELETAL:  No edema; No deformity  SKIN: Warm  and dry NEUROLOGIC:  Alert and oriented x 3 PSYCHIATRIC:  Normal affect    Signed, Norman Herrlich, MD  04/11/2023 1:07 PM    [facility]

## 2023-04-11 ENCOUNTER — Ambulatory Visit: Payer: Medicare HMO | Attending: Cardiology | Admitting: Cardiology

## 2023-04-11 ENCOUNTER — Encounter: Payer: Self-pay | Admitting: Cardiology

## 2023-04-11 VITALS — BP 132/68 | HR 73 | Ht 60.0 in | Wt 141.4 lb

## 2023-04-11 DIAGNOSIS — I25118 Atherosclerotic heart disease of native coronary artery with other forms of angina pectoris: Secondary | ICD-10-CM | POA: Diagnosis not present

## 2023-04-11 DIAGNOSIS — I447 Left bundle-branch block, unspecified: Secondary | ICD-10-CM

## 2023-04-11 DIAGNOSIS — I10 Essential (primary) hypertension: Secondary | ICD-10-CM

## 2023-04-11 DIAGNOSIS — E7800 Pure hypercholesterolemia, unspecified: Secondary | ICD-10-CM

## 2023-04-11 NOTE — Patient Instructions (Signed)

## 2024-03-26 DIAGNOSIS — Z9889 Other specified postprocedural states: Secondary | ICD-10-CM | POA: Insufficient documentation

## 2024-04-11 ENCOUNTER — Ambulatory Visit: Attending: Cardiology | Admitting: Cardiology

## 2024-04-11 VITALS — BP 140/60 | HR 73 | Ht 60.0 in | Wt 139.4 lb

## 2024-04-11 DIAGNOSIS — I447 Left bundle-branch block, unspecified: Secondary | ICD-10-CM

## 2024-04-11 DIAGNOSIS — E7800 Pure hypercholesterolemia, unspecified: Secondary | ICD-10-CM | POA: Diagnosis not present

## 2024-04-11 DIAGNOSIS — I10 Essential (primary) hypertension: Secondary | ICD-10-CM | POA: Diagnosis not present

## 2024-04-11 DIAGNOSIS — I25118 Atherosclerotic heart disease of native coronary artery with other forms of angina pectoris: Secondary | ICD-10-CM

## 2024-04-11 DIAGNOSIS — Z9889 Other specified postprocedural states: Secondary | ICD-10-CM

## 2024-04-11 MED ORDER — CLOPIDOGREL BISULFATE 75 MG PO TABS: 75.0000 mg | ORAL_TABLET | Freq: Every day | ORAL | 3 refills | Status: AC

## 2024-04-11 MED ORDER — DILTIAZEM HCL ER COATED BEADS 120 MG PO CP24: 120.0000 mg | ORAL_CAPSULE | Freq: Every day | ORAL | 3 refills | Status: AC

## 2024-04-11 MED ORDER — NITROGLYCERIN 0.4 MG SL SUBL: 0.4000 mg | SUBLINGUAL_TABLET | SUBLINGUAL | 2 refills | Status: AC | PRN

## 2024-04-11 NOTE — Progress Notes (Signed)
 Cardiology Office Note:    Date:  04/11/2024   ID:  Marisa Callahan, DOB 03-28-1951, MRN [id number]  PCP:  Jama Chow, MD  Cardiologist:  Redell Leiter, MD    Referring MD: Jama Chow, MD    ASSESSMENT:    1. Coronary artery disease of native artery of native heart with stable angina pectoris   2. Left bundle branch block   3. Essential hypertension   4. Hypercholesteremia   5. History of carotid endarterectomy    PLAN:    In order of problems listed above:  Doing well with CAD no anginal discomfort and she will continue her current medical treatment including long-term aspirin  and clopidogrel  with her PAD carotid disease along with lipid-lowering atorvastatin and Zetia and rate limiting calcium channel blocker Stable EKG pattern by today's tracing Well-controlled Home blood pressure runs typically 06/26/1928 60-70 continue ACE inhibitor Recheck a lipid profile today   Next appointment: 1 year   Medication Adjustments/Labs and Tests Ordered: Current medicines are reviewed at length with the patient today.  Concerns regarding medicines are outlined above.  Orders Placed This Encounter  Procedures   EKG 12-Lead   No orders of the defined types were placed in this encounter.    History of Present Illness:    Marisa Callahan is a 73 y.o. female with a hx of CAD with PCI in 1999 left bundle branch block open surgery abdominal aortic aneurysm with aortobifemoral bypass 2011 history of ischemic colitis mesenteric ischemia and PCI and stent previous GI bleed hypertension hyperlipidemia type 2 diabetes and carotid artery disease  last seen 04/11/2023.  She had recent right carotid endarterectomy [facility] discharged 03/26/2024.  Postoperative course described as uncomplicated.  EKG 03/19/2024 showed left bundle branch block nonspecific T waves.  Angiography showed the presence of critical stenosis right cervical ICA secondary to pulse key calcified atherosclerotic plaque.  Less than  50% stenosis was seen in the left carotid and moderate stenosis in the vertebral artery origins bilaterally.  Compliance with diet, lifestyle and medications: She feels well and is compliant with her medications  She made a good recovery from her carotid surgery feels she is back to normal has had no vascular symptoms of edema shortness of breath chest pain palp potation or syncope Her last lipid profile was a year ago 04/25/2023 cholesterol 152 LDL 67 non-HDL cholesterol 99 Past Medical History:  Diagnosis Date   Abdominal pain 02/28/2017   Abnormal myocardial perfusion study 06/19/2017   Anemia 03/02/2017   Anxiety 02/28/2017   Arthritis 02/28/2017   Atherosclerosis of native artery of both lower extremities 09/28/2015   Benign essential hypertension 02/11/2021   Bilateral carotid artery stenosis 09/28/2015   Boil of buttock 02/28/2017   Overview:  right   Cancer (HCC) 1977   cervix    Colitis 02/28/2017   Colon polyp    Coronary artery disease 02/28/2017   Overview:  history of coronary stents   Coronary artery disease involving native coronary artery of native heart with angina pectoris 09/28/2015   Dehydration 02/28/2017   Depression 02/28/2017   Diabetes mellitus (HCC) 02/28/2017   GERD (gastroesophageal reflux disease) 02/28/2017   GI bleed    History of AAA (abdominal aortic aneurysm) repair 12/03/2009   with [place]   Hypercholesteremia 02/28/2017   Hypertension 02/28/2017   Hypotension 02/28/2017   [facility] 02/19/17   Incisional hernia, incarcerated 02/28/2017   Iron deficiency anemia due to chronic blood loss 05/14/2020  Left bundle branch block 02/28/2017   Legally blind in right eye, as defined in [place]  02/28/2017   MI (myocardial infarction) (HCC) 1999   Osteoarthritis of spine 02/28/2017   PVD (peripheral vascular disease) 09/28/2015   Sinus tachycardia    Stroke (HCC) 02/14/2014   right eye stroke   UTI (urinary tract  infection) 02/28/2017   Ventral hernia 09/28/2015    Current Medications: Current Meds  Medication Sig   acetaminophen (TYLENOL) 500 MG tablet Take 1,000-1,500 mg by mouth every 8 (eight) hours as needed for moderate pain or headache.   amLODipine (NORVASC) 10 MG tablet Take 10 mg by mouth daily.   aspirin  EC 81 MG tablet Take 81 mg by mouth at bedtime.    atorvastatin (LIPITOR) 80 MG tablet Take 80 mg by mouth daily.   Calcium Carb-Cholecalciferol (CALCIUM 600 + D PO) Take 1 tablet by mouth 2 (two) times daily.   citalopram (CELEXA) 20 MG tablet Take 20 mg by mouth daily.   clopidogrel  (PLAVIX ) 75 MG tablet Take 1 tablet (75 mg total) by mouth daily.   diltiazem  (CARDIZEM  CD) 120 MG 24 hr capsule Take 1 capsule (120 mg total) by mouth daily.   ezetimibe (ZETIA) 10 MG tablet Take 10 mg by mouth daily.   furosemide (LASIX) 20 MG tablet Take 20 mg by mouth daily as needed.   glimepiride (AMARYL) 4 MG tablet Take 4 mg by mouth every morning.   Lancet Devices (ONETOUCH DELICA PLUS LANCING) MISC as directed.   lisinopril (ZESTRIL) 30 MG tablet Take 30 mg by mouth daily.    nitroGLYCERIN  (NITROSTAT ) 0.4 MG SL tablet Place 1 tablet (0.4 mg total) under the tongue every 5 (five) minutes as needed for chest pain.   ONETOUCH ULTRA test strip 1 each by Other route daily.   pantoprazole  (PROTONIX ) 40 MG tablet Take 40 mg by mouth daily.      EKGs/Labs/Other Studies Reviewed:    The following studies were reviewed today:  Cardiac Studies & Procedures   ______________________________________________________________________________________________   STRESS TESTS  MYOCARDIAL PERFUSION IMAGING 06/13/2017  Interpretation Summary  Nuclear stress EF: 51%.  There was no ST segment deviation noted during stress.  Defect 1: There is a large defect present in the basal anteroseptal, basal inferoseptal, mid anteroseptal, mid inferoseptal and apical septal location.  Defect 2: There is a medium  defect of moderate severity present in the basal inferior, mid inferior and apical inferior location.  This is a high risk study.  The left ventricular ejection fraction is mildly decreased (45-54%).  This is an abnormal study. There are two defects, a large fixed defect in the basal and mid inferoseptal, anteroseptal and apical septal walls consistent with a prior infarct but possibly related to LBBB. There is a medium size, mild severity, reversible defect in the basal, mid and apical inferior walls consistent with ischemia in the RCA territory..            ______________________________________________________________________________________________      EKG Interpretation Date/Time:  Thursday April 11 2024 13:10:15 EST Ventricular Rate:  73 PR Interval:  156 QRS Duration:  120 QT Interval:  392 QTC Calculation: 431 R Axis:   57  Text Interpretation: Normal sinus rhythm Incomplete left bundle branch block When compared with ECG of 11-Apr-2023 12:52, Premature ventricular complexes are no longer Present Confirmed by Monetta Rogue ([id number]) on 04/11/2024 1:24:52 PM    Physical Exam:    VS:  BP (!) 140/60   Pulse 73  Ht 5' (1.524 m)   Wt 139 lb 6.4 oz (63.2 kg)   SpO2 98%   BMI 27.22 kg/m     Wt Readings from Last 3 Encounters:  04/11/24 139 lb 6.4 oz (63.2 kg)  04/11/23 141 lb 6.4 oz (64.1 kg)  04/07/22 158 lb (71.7 kg)     GEN:  Well nourished, well developed in no acute distress HEENT: Normal NECK: No JVD; No carotid bruits LYMPHATICS: No lymphadenopathy CARDIAC: RRR, no murmurs, rubs, gallops RESPIRATORY:  Clear to auscultation without rales, wheezing or rhonchi  ABDOMEN: Soft, non-tender, non-distended MUSCULOSKELETAL:  No edema; No deformity  SKIN: Warm and dry NEUROLOGIC:  Alert and oriented x 3 PSYCHIATRIC:  Normal affect    Signed, Redell Leiter, MD  04/11/2024 1:34 PM    [facility]

## 2024-04-11 NOTE — Patient Instructions (Signed)
 Medication Instructions:  Your physician recommends that you continue on your current medications as directed. Please refer to the Current Medication list given to you today.  *If you need a refill on your cardiac medications before your next appointment, please call your pharmacy*  Lab Work: Your physician recommends that you return for lab work in:   Labs today: Lipids  If you have labs (blood work) drawn today and your tests are completely normal, you will receive your results only by: MyChart Message (if you have MyChart) OR A paper copy in the mail If you have any lab test that is abnormal or we need to change your treatment, we will call you to review the results.  Testing/Procedures: None  Follow-Up: At [facility], you and your health needs are our priority.  As part of our continuing mission to provide you with exceptional heart care, our providers are all part of one team.  This team includes your primary Cardiologist (physician) and Advanced Practice Providers or APPs (Physician Assistants and Nurse Practitioners) who all work together to provide you with the care you need, when you need it.  Your next appointment:   1 year(s)  Provider:   Redell Leiter, MD    We recommend signing up for the patient portal called MyChart.  Sign up information is provided on this After Visit Summary.  MyChart is used to connect with patients for Virtual Visits (Telemedicine).  Patients are able to view lab/test results, encounter notes, upcoming appointments, etc.  Non-urgent messages can be sent to your provider as well.   To learn more about what you can do with MyChart, go to [email or web address].   Other Instructions None

## 2024-04-12 ENCOUNTER — Ambulatory Visit: Payer: Self-pay | Admitting: Cardiology

## 2024-04-12 LAB — LIPID PANEL
Chol/HDL Ratio: 1.9 ratio (ref 0.0–4.4)
Cholesterol, Total: 143 mg/dL (ref 100–199)
HDL: 77 mg/dL (ref >39)
LDL Chol Calc (NIH): 51 mg/dL (ref 0–99)
Triglycerides: 75 mg/dL (ref 0–149)
VLDL Cholesterol Cal: 15 mg/dL (ref 5–40)

## 2024-04-17 NOTE — Telephone Encounter (Signed)
 Patient returned RN's call regarding results.
